# Patient Record
Sex: Male | Born: 1965
Health system: Southern US, Community
[De-identification: ages and names within clinical notes are randomized; demographics above are authoritative.]

## PROBLEM LIST (undated history)

## (undated) DIAGNOSIS — M5030 Other cervical disc degeneration, unspecified cervical region: Secondary | ICD-10-CM

## (undated) DIAGNOSIS — I10 Essential (primary) hypertension: Secondary | ICD-10-CM

## (undated) DIAGNOSIS — E785 Hyperlipidemia, unspecified: Secondary | ICD-10-CM

## (undated) DIAGNOSIS — N400 Enlarged prostate without lower urinary tract symptoms: Secondary | ICD-10-CM

## (undated) DIAGNOSIS — I2510 Atherosclerotic heart disease of native coronary artery without angina pectoris: Secondary | ICD-10-CM

## (undated) DIAGNOSIS — N182 Chronic kidney disease, stage 2 (mild): Secondary | ICD-10-CM

## (undated) DIAGNOSIS — E119 Type 2 diabetes mellitus without complications: Secondary | ICD-10-CM

## (undated) DIAGNOSIS — J9819 Other pulmonary collapse: Secondary | ICD-10-CM

## (undated) HISTORY — PX: HERNIA REPAIR: SHX51

## (undated) HISTORY — DX: Type 2 diabetes mellitus without complications: E11.9

## (undated) HISTORY — DX: Hyperlipidemia, unspecified: E78.5

## (undated) HISTORY — DX: Essential (primary) hypertension: I10

## (undated) HISTORY — DX: Atherosclerotic heart disease of native coronary artery without angina pectoris: I25.10

## (undated) HISTORY — DX: Chronic kidney disease, stage 2 (mild): N18.2

## (undated) HISTORY — PX: APPENDECTOMY: SHX54

## (undated) NOTE — *Deleted (*Deleted)
Progress Note  Patient Name: Guy [name] Date of Encounter: 01/21/2020  [facility] Cardiologist: No primary care provider on file. ***  Subjective   ***  Inpatient Medications    Scheduled Meds: . amLODipine  2.5 mg Oral Daily  . aspirin EC  81 mg Oral Daily  . atorvastatin  80 mg Oral Daily  . insulin aspart  0-15 Units Subcutaneous TID WC  . insulin aspart  0-5 Units Subcutaneous QHS  . metoprolol tartrate  25 mg Oral BID  . sodium chloride flush  3 mL Intravenous Q12H  . sodium chloride flush  3 mL Intravenous Q12H   Continuous Infusions: . sodium chloride    . sodium chloride    . sodium chloride    . heparin 1,300 Units/hr (01/21/20 1914)  . nitroGLYCERIN Stopped (01/21/20 0010)   PRN Meds: sodium chloride, sodium chloride, acetaminophen, nitroGLYCERIN, ondansetron (ZOFRAN) IV, sodium chloride flush, sodium chloride flush   Vital Signs    Vitals:   01/21/20 0453 01/21/20 0859 01/21/20 1013 01/21/20 1300  BP:  (!) 153/82 (!) 158/84 (!) 142/68  Pulse: 65 67 67 67  Resp: 18 18  18   Temp: 98 F (36.7 C) 98.2 F (36.8 C)  98.8 F (37.1 C)  TempSrc: Oral Oral  Oral  SpO2: 98%   99%  Weight: 88.7 kg     Height:        Intake/Output Summary (Last 24 hours) at 01/21/2020 1958 Last data filed at 01/21/2020 1914 Gross per 24 hour  Intake 586.99 ml  Output 2675 ml  Net -2088.01 ml   Last 3 Weights 01/21/2020 01/20/2020 01/20/2020  Weight (lbs) 195 lb 8.8 oz 199 lb 1.6 oz 197 lb 15.6 oz  Weight (kg) 88.7 kg 90.311 kg 89.8 kg      Telemetry    *** - Personally Reviewed  ECG    *** - Personally Reviewed  Physical Exam  *** GEN: No acute distress.   Neck: No JVD Cardiac: RRR, no murmurs, rubs, or gallops.  Respiratory: Clear to auscultation bilaterally. GI: Soft, nontender, non-distended  MS: No edema; No deformity. Neuro:  Nonfocal  Psych: Normal affect   Labs    High Sensitivity Troponin:   Recent Labs  Lab 01/20/20 0619  01/20/20 0819  TROPONINIHS 390* 774*      Chemistry Recent Labs  Lab 01/20/20 0619  NA 137  K 3.8  CL 99  CO2 30  GLUCOSE 123*  BUN 15  CREATININE 1.27*  CALCIUM 9.8  GFRNONAA >60  ANIONGAP 8     Hematology Recent Labs  Lab 01/20/20 0619 01/21/20 0308  WBC 9.2 11.5*  RBC 5.00 4.88  HGB 15.7 14.9  HCT 44.8 43.2  MCV 89.6 88.5  MCH 31.4 30.5  MCHC 35.0 34.5  RDW 12.2 12.3  PLT 187 196    BNPNo results for input(s): BNP, PROBNP in the last 168 hours.   DDimer No results for input(s): DDIMER in the last 168 hours.   Radiology    DG Chest 2 View  Result Date: 01/20/2020 CLINICAL DATA:  Chest pain EXAM: CHEST - 2 VIEW COMPARISON:  06/26/2013 FINDINGS: At 0640 hours. Asymmetric elevation right diaphragm. The lungs are clear without focal pneumonia, edema, pneumothorax or pleural effusion. The cardiopericardial silhouette is within normal limits for size. The visualized bony structures of the thorax show no acute abnormality. IMPRESSION: No active cardiopulmonary disease. Electronically Signed   By: Kennith Center M.D.   On: 01/20/2020 07:20  ECHOCARDIOGRAM COMPLETE  Result Date: 01/21/2020    ECHOCARDIOGRAM REPORT   Patient Name:   Guy [name] Date of Exam: 01/21/2020 Medical Rec #:  [id number]          Height:       70.0 in Accession #:    [id number]         Weight:       195.5 lb Date of Birth:  Jul 11, 1965          BSA:          2.067 m Patient Age:    54 years           BP:           146/91 mmHg Patient Gender: M                  HR:           60 bpm. Exam Location:  Inpatient Procedure: 2D Echo, Cardiac Doppler and Color Doppler Indications:    R07.9* Chest pain, unspecified  History:        Patient has no prior history of Echocardiogram examinations.                 Signs/Symptoms:Chest Pain; Risk Factors:Hypertension and                 Diabetes.  Sonographer:    Eulah Pont RDCS Referring Phys: [id number] JAMES ALLRED IMPRESSIONS  1. Left ventricular ejection  fraction, by estimation, is 60 to 65%. The left ventricle has normal function. The left ventricle has no regional wall motion abnormalities. Left ventricular diastolic parameters are consistent with Grade I diastolic dysfunction (impaired relaxation).  2. Right ventricular systolic function is normal. The right ventricular size is normal.  3. The mitral valve is normal in structure. Trivial mitral valve regurgitation. No evidence of mitral stenosis.  4. The aortic valve is tricuspid. Aortic valve regurgitation is not visualized. No aortic stenosis is present.  5. The inferior vena cava is normal in size with greater than 50% respiratory variability, suggesting right atrial pressure of 3 mmHg. FINDINGS  Left Ventricle: Left ventricular ejection fraction, by estimation, is 60 to 65%. The left ventricle has normal function. The left ventricle has no regional wall motion abnormalities. The left ventricular internal cavity size was normal in size. There is  no left ventricular hypertrophy. Left ventricular diastolic parameters are consistent with Grade I diastolic dysfunction (impaired relaxation). Right Ventricle: The right ventricular size is normal. Right ventricular systolic function is normal. Left Atrium: Left atrial size was normal in size. Right Atrium: Right atrial size was normal in size. Pericardium: There is no evidence of pericardial effusion. Mitral Valve: The mitral valve is normal in structure. Mild mitral annular calcification. Trivial mitral valve regurgitation. No evidence of mitral valve stenosis. Tricuspid Valve: The tricuspid valve is normal in structure. Tricuspid valve regurgitation is trivial. No evidence of tricuspid stenosis. Aortic Valve: The aortic valve is tricuspid. Aortic valve regurgitation is not visualized. No aortic stenosis is present. Pulmonic Valve: The pulmonic valve was normal in structure. Pulmonic valve regurgitation is not visualized. No evidence of pulmonic stenosis. Aorta:  The aortic root is normal in size and structure. Venous: The inferior vena cava is normal in size with greater than 50% respiratory variability, suggesting right atrial pressure of 3 mmHg.  LEFT VENTRICLE PLAX 2D LVIDd:         5.40 cm  Diastology LVIDs:  3.80 cm  LV e' medial:    6.30 cm/s LV PW:         1.00 cm  LV E/e' medial:  10.8 LV IVS:        1.00 cm  LV e' lateral:   8.18 cm/s LVOT diam:     1.70 cm  LV E/e' lateral: 8.3 LV SV:         54 LV SV Index:   26 LVOT Area:     2.27 cm  RIGHT VENTRICLE RV S prime:     13.00 cm/s TAPSE (M-mode): 2.0 cm LEFT ATRIUM             Index       RIGHT ATRIUM           Index LA diam:        3.40 cm 1.64 cm/m  RA Area:     10.10 cm LA Vol (A2C):   33.8 ml 16.35 ml/m RA Volume:   19.10 ml  9.24 ml/m LA Vol (A4C):   25.8 ml 12.48 ml/m LA Biplane Vol: 30.5 ml 14.75 ml/m  AORTIC VALVE LVOT Vmax:   122.00 cm/s LVOT Vmean:  84.400 cm/s LVOT VTI:    0.240 m  AORTA Ao Root diam: 3.10 cm Ao Asc diam:  2.90 cm MITRAL VALVE MV Area (PHT): 3.99 cm    SHUNTS MV Decel Time: 190 msec    Systemic VTI:  0.24 m MV E velocity: 68.30 cm/s  Systemic Diam: 1.70 cm MV A velocity: 66.80 cm/s MV E/A ratio:  1.02 Olga Millers MD Electronically signed by Olga Millers MD Signature Date/Time: 01/21/2020/10:38:40 AM    Final     Cardiac Studies   TTE 01/21/20: 1. Left ventricular ejection fraction, by estimation, is 60 to 65%. The  left ventricle has normal function. The left ventricle has no regional  wall motion abnormalities. Left ventricular diastolic parameters are  consistent with Grade I diastolic  dysfunction (impaired relaxation).  2. Right ventricular systolic function is normal. The right ventricular  size is normal.  3. The mitral valve is normal in structure. Trivial mitral valve  regurgitation. No evidence of mitral stenosis.  4. The aortic valve is tricuspid. Aortic valve regurgitation is not  visualized. No aortic stenosis is present.  5. The  inferior vena cava is normal in size with greater than 50%  respiratory variability, suggesting right atrial pressure of 3 mmHg.   Patient Profile     74 y.o. male with history of DMII and HTN who presented with chest pain found to have NSTEMI.  Assessment & Plan    #NSTEMI:  #HTN  #HLD  #DMII  #AKI  For questions or updates, please contact [facility] Please consult [email or web address] for contact info under        Signed, Meriam Sprague, MD  01/21/2020, 7:58 PM

---

## 1997-11-01 ENCOUNTER — Ambulatory Visit (HOSPITAL_BASED_OUTPATIENT_CLINIC_OR_DEPARTMENT_OTHER): Admission: RE | Admit: 1997-11-01 | Discharge: 1997-11-01 | Payer: Self-pay | Admitting: Surgery

## 2004-04-28 ENCOUNTER — Ambulatory Visit: Admission: RE | Admit: 2004-04-28 | Discharge: 2004-04-28 | Payer: Self-pay | Admitting: Internal Medicine

## 2008-01-16 ENCOUNTER — Encounter: Admission: RE | Admit: 2008-01-16 | Discharge: 2008-01-16 | Payer: Self-pay | Admitting: Orthopedic Surgery

## 2008-02-08 ENCOUNTER — Encounter: Payer: Self-pay | Admitting: Internal Medicine

## 2008-02-08 ENCOUNTER — Encounter: Admission: RE | Admit: 2008-02-08 | Discharge: 2008-02-08 | Payer: Self-pay | Admitting: Rheumatology

## 2008-07-31 ENCOUNTER — Encounter: Admission: RE | Admit: 2008-07-31 | Discharge: 2008-07-31 | Payer: Self-pay | Admitting: Internal Medicine

## 2008-08-09 ENCOUNTER — Encounter: Payer: Self-pay | Admitting: Internal Medicine

## 2008-08-20 ENCOUNTER — Encounter: Payer: Self-pay | Admitting: Internal Medicine

## 2008-09-24 DIAGNOSIS — E785 Hyperlipidemia, unspecified: Secondary | ICD-10-CM | POA: Insufficient documentation

## 2008-09-24 DIAGNOSIS — J930 Spontaneous tension pneumothorax: Secondary | ICD-10-CM | POA: Insufficient documentation

## 2008-09-24 DIAGNOSIS — J939 Pneumothorax, unspecified: Secondary | ICD-10-CM | POA: Insufficient documentation

## 2008-10-15 ENCOUNTER — Ambulatory Visit: Payer: Self-pay | Admitting: Internal Medicine

## 2008-10-15 DIAGNOSIS — R0602 Shortness of breath: Secondary | ICD-10-CM | POA: Insufficient documentation

## 2010-03-31 ENCOUNTER — Encounter: Payer: Self-pay | Admitting: Internal Medicine

## 2010-05-27 ENCOUNTER — Telehealth: Payer: Self-pay | Admitting: Internal Medicine

## 2010-06-05 NOTE — Progress Notes (Signed)
Summary: no show for ct aug 2010  ---- Converted from flag ---- ---- 10/18/2008 10:09 AM, Carron Curie CMA wrote: spoke with Okey Dupre, pt has not scheduled CT yet, she tried to contact pt yesterday and left a message for him to call and schedule.  ---- 10/15/2008 4:31 PM, Kalman Shan MD wrote: pls ensure ct chest ------------------------------

## 2010-06-05 NOTE — Progress Notes (Signed)
Summary: no show for ct chest  ---- Converted from flag ---- ---- 10/24/2008 2:45 PM, Carron Curie CMA wrote: [name] I have attempted to cll the patient as well. I will let MR know.  ---- 10/24/2008 2:40 PM, Alfonso Ramus wrote: On the day of pt's ov, he refused to schedule CT Chest. Pt stated he would have to have his calendar with him when he made appt.  Rose at [facility]. CT has called pt x 5 times with no answer and has LMOAM for him to call her to schedule. I called yesterday and again pt didn't answer phone and it rolled over to his voice mail. I LMOAM for pt to call me, that we need to get CT scheduled. Pt has not returned my call or Rose's calls. Just wanted MR to be aware. Thanks, Bjorn Loser ------------------------------

## 2013-06-26 ENCOUNTER — Emergency Department (HOSPITAL_COMMUNITY)
Admission: EM | Admit: 2013-06-26 | Discharge: 2013-06-26 | Disposition: A | Discharge status: Home / Self Care | Payer: BC Managed Care – PPO | Attending: Emergency Medicine | Admitting: Emergency Medicine

## 2013-06-26 ENCOUNTER — Telehealth (HOSPITAL_BASED_OUTPATIENT_CLINIC_OR_DEPARTMENT_OTHER): Payer: Self-pay

## 2013-06-26 ENCOUNTER — Encounter (HOSPITAL_COMMUNITY): Payer: Self-pay | Admitting: Emergency Medicine

## 2013-06-26 ENCOUNTER — Emergency Department (HOSPITAL_COMMUNITY): Payer: BC Managed Care – PPO

## 2013-06-26 DIAGNOSIS — Z7982 Long term (current) use of aspirin: Secondary | ICD-10-CM | POA: Insufficient documentation

## 2013-06-26 DIAGNOSIS — Z79899 Other long term (current) drug therapy: Secondary | ICD-10-CM | POA: Insufficient documentation

## 2013-06-26 DIAGNOSIS — Z880 Allergy status to penicillin: Secondary | ICD-10-CM | POA: Insufficient documentation

## 2013-06-26 DIAGNOSIS — Z87448 Personal history of other diseases of urinary system: Secondary | ICD-10-CM | POA: Insufficient documentation

## 2013-06-26 DIAGNOSIS — R5381 Other malaise: Secondary | ICD-10-CM | POA: Insufficient documentation

## 2013-06-26 DIAGNOSIS — E119 Type 2 diabetes mellitus without complications: Secondary | ICD-10-CM | POA: Insufficient documentation

## 2013-06-26 DIAGNOSIS — R5383 Other fatigue: Secondary | ICD-10-CM

## 2013-06-26 DIAGNOSIS — Z8709 Personal history of other diseases of the respiratory system: Secondary | ICD-10-CM | POA: Insufficient documentation

## 2013-06-26 DIAGNOSIS — R000 Tachycardia, unspecified: Secondary | ICD-10-CM | POA: Insufficient documentation

## 2013-06-26 HISTORY — DX: Benign prostatic hyperplasia without lower urinary tract symptoms: N40.0

## 2013-06-26 HISTORY — DX: Other pulmonary collapse: J98.19

## 2013-06-26 LAB — CBC
HCT: 44.3 % (ref 39.0–52.0)
Hemoglobin: 16.3 g/dL (ref 13.0–17.0)
MCH: 31.2 pg (ref 26.0–34.0)
MCHC: 36.8 g/dL — AB (ref 30.0–36.0)
MCV: 84.9 fL (ref 78.0–100.0)
PLATELETS: 214 10*3/uL (ref 150–400)
RBC: 5.22 MIL/uL (ref 4.22–5.81)
RDW: 12.1 % (ref 11.5–15.5)
WBC: 11 10*3/uL — AB (ref 4.0–10.5)

## 2013-06-26 LAB — HEPATIC FUNCTION PANEL
ALT: 31 U/L (ref 0–53)
AST: 23 U/L (ref 0–37)
Albumin: 4.2 g/dL (ref 3.5–5.2)
Alkaline Phosphatase: 150 U/L — ABNORMAL HIGH (ref 39–117)
Total Bilirubin: 1.2 mg/dL (ref 0.3–1.2)
Total Protein: 7.8 g/dL (ref 6.0–8.3)

## 2013-06-26 LAB — URINALYSIS, ROUTINE W REFLEX MICROSCOPIC
BILIRUBIN URINE: NEGATIVE
KETONES UR: NEGATIVE mg/dL
Leukocytes, UA: NEGATIVE
Nitrite: NEGATIVE
PH: 5.5 (ref 5.0–8.0)
Protein, ur: 100 mg/dL — AB
SPECIFIC GRAVITY, URINE: 1.026 (ref 1.005–1.030)
Urobilinogen, UA: 0.2 mg/dL (ref 0.0–1.0)

## 2013-06-26 LAB — BASIC METABOLIC PANEL
BUN: 22 mg/dL (ref 6–23)
CALCIUM: 11 mg/dL — AB (ref 8.4–10.5)
CHLORIDE: 92 meq/L — AB (ref 96–112)
CO2: 23 mEq/L (ref 19–32)
CREATININE: 1.06 mg/dL (ref 0.50–1.35)
GFR, EST NON AFRICAN AMERICAN: 82 mL/min — AB (ref >90)
Glucose, Bld: 411 mg/dL — ABNORMAL HIGH (ref 70–99)
Potassium: 4.4 mEq/L (ref 3.7–5.3)
Sodium: 132 mEq/L — ABNORMAL LOW (ref 137–147)

## 2013-06-26 LAB — BLOOD GAS, VENOUS
ACID-BASE EXCESS: 3.6 mmol/L — AB (ref 0.0–2.0)
BICARBONATE: 28.8 meq/L — AB (ref 20.0–24.0)
FIO2: 0.21 %
O2 SAT: 48.7 %
PATIENT TEMPERATURE: 98.6
TCO2: 24.9 mmol/L (ref 0–100)
pCO2, Ven: 47.3 mmHg (ref 45.0–50.0)
pH, Ven: 7.402 — ABNORMAL HIGH (ref 7.250–7.300)

## 2013-06-26 LAB — HEMOGLOBIN A1C
Hgb A1c MFr Bld: 8.9 % — ABNORMAL HIGH (ref <5.7)
Mean Plasma Glucose: 209 mg/dL — ABNORMAL HIGH (ref <117)

## 2013-06-26 LAB — I-STAT TROPONIN, ED: TROPONIN I, POC: 0.01 ng/mL (ref 0.00–0.08)

## 2013-06-26 LAB — CBG MONITORING, ED
GLUCOSE-CAPILLARY: 280 mg/dL — AB (ref 70–99)
Glucose-Capillary: 239 mg/dL — ABNORMAL HIGH (ref 70–99)

## 2013-06-26 LAB — D-DIMER, QUANTITATIVE (NOT AT ARMC): D DIMER QUANT: 0.29 ug{FEU}/mL (ref 0.00–0.48)

## 2013-06-26 LAB — URINE MICROSCOPIC-ADD ON

## 2013-06-26 LAB — PRO B NATRIURETIC PEPTIDE: PRO B NATRI PEPTIDE: 12.7 pg/mL (ref 0–125)

## 2013-06-26 LAB — TROPONIN I

## 2013-06-26 MED ORDER — METFORMIN HCL 500 MG PO TABS: 500.0000 mg | ORAL_TABLET | Freq: Two times a day (BID) | ORAL | Status: DC

## 2013-06-26 MED ORDER — LIVING WELL WITH DIABETES BOOK
Freq: Once | Status: DC
  Filled 2013-06-26: qty 1

## 2013-06-26 MED ORDER — SODIUM CHLORIDE 0.9 % IV BOLUS (SEPSIS)
1000.0000 mL | Freq: Once | INTRAVENOUS | Status: AC
  Administered 2013-06-26: 1000 mL via INTRAVENOUS

## 2013-06-26 MED ORDER — METFORMIN HCL 500 MG PO TABS
500.0000 mg | ORAL_TABLET | Freq: Once | ORAL | Status: AC
  Administered 2013-06-26: 500 mg via ORAL
  Filled 2013-06-26: qty 1

## 2013-06-26 MED ORDER — ASPIRIN 81 MG PO CHEW
324.0000 mg | CHEWABLE_TABLET | Freq: Once | ORAL | Status: AC
  Administered 2013-06-26: 324 mg via ORAL
  Filled 2013-06-26: qty 4

## 2013-06-26 MED ORDER — INSULIN ASPART 100 UNIT/ML ~~LOC~~ SOLN
6.0000 [IU] | Freq: Once | SUBCUTANEOUS | Status: AC
  Administered 2013-06-26: 6 [IU] via SUBCUTANEOUS
  Filled 2013-06-26: qty 1

## 2013-06-26 MED ORDER — BLOOD GLUCOSE METER KIT: PACK | Status: DC

## 2013-06-26 MED ORDER — LANCETS THIN MISC: 1.0000 | Freq: Three times a day (TID) | Status: DC

## 2013-06-26 NOTE — Progress Notes (Addendum)
Inpatient Diabetes Program Recommendations  AACE/ADA: New Consensus Statement on Inpatient Glycemic Control (2013)  Target Ranges:  Prepandial:   less than 140 mg/dL      Peak postprandial:   less than 180 mg/dL (1-2 hours)      Critically ill patients:  140 - 180 mg/dL   Reason for Visit: Diabetes Consult  Diabetes history: Newly-diagnosed Outpatient Diabetes medications: None Current orders for Inpatient glycemic control: None - Received Novolog 6 units.  Pt states he has family hx DM. Has c/o thirst, blurred vision and feeling tired. States his PCP is Dr. Selena BattenKim and he was planning to make appt with him this week. Pt is willing to purchase meter to check blood sugars and would like to go to [facility] for OP Diabetes Education. States weight has been about the same for the past few years. Would like to lose approx 40 pounds. Eats out occasionally and ususally takes his lunch to work. Walks several miles/day at work. No ETOH.  Lengthy discussion regarding new diagnosis of DM, importance of glucose control to prevent long-term complications, and f/u with PCP to manage DM. Discussed how diet, exercise and stress affect blood sugars. Discussed decreasing intake of simple CHOs such as apple juice, orange juice, regular sodas and Gatorade. Encouraged to eat regular meals and decrease portion sizes. Focus on lean meats, low-fat dairy products, along with high fiber foods and plenty of vegetables, beans. Living Well With Diabetes book has been ordered, along with Exit Care Notes. Answered questions and discussed above with RN.  Recommendations:  Novolog sensitive Q4H or when po diet begins, tidwc and hs. Check HgbA1C to assess glycemic control prior to hospitalization. OP Diabetes Education at [facility] for newly-diagnosed DM When discharged, purchase home glucose meter and check blood sugars 3-4 times/day. Take logbook to PCP for glycemic  control management. Metformin 500 QD. If tolerated, 500 bid.   **Will need prescription for meter, strips and lancets.  Thank you. Ailene Ardshonda Vikki Gains, RD, LDN, CDE Inpatient Diabetes Coordinator [phone]

## 2013-06-26 NOTE — ED Notes (Signed)
Patient informed of serum glucose and rationale for administering insulin Patient now reports increased thirst and urination for the past week MD at bedside and is aware

## 2013-06-26 NOTE — Progress Notes (Signed)
  CARE MANAGEMENT ED NOTE 06/26/2013  Patient:  Guy [name],Guy [name]   Account Number:  [id number]  Date Initiated:  06/26/2013  Documentation initiated by:  Richelle Glick  Subjective/Objective Assessment:   48 yr old self [place] pt with new onset DM II seen by DM coordinator in [facility] ED     Subjective/Objective Assessment Detail:   Pt states his pcp is [facility] Kim EPIC updated and he will be returning to speak with a nutritionist set up by DM coordinator in 1-2 weeks     Action/Plan:   ED Cm spoke with pt about a local DM discussion group on Aug 04 2013 and provided local Dm resources related to glucometers, and medications   Action/Plan Detail:   CM answered pt questions   Anticipated DC Date:  06/26/2013     Status Recommendation to Physician:   Result of Recommendation:    Other ED Services  Consult Working Plan    DC Planning Services  Other  PCP issues  Outpatient Services - Pt will follow up    Choice offered to / List presented to:            Status of service:  Completed, signed off  ED Comments:   ED Comments Detail:     

## 2013-06-26 NOTE — ED Notes (Signed)
Patient transported to X-ray 

## 2013-06-26 NOTE — ED Notes (Signed)
Patient ambulated without difficulty. Patient's o2 sat went to 96%. Oxygen sat stayed between 96% and 97%.Patient states that he feels much better and doesn't feel as short of breath. RN Morrie SheldonAshley notified.

## 2013-06-26 NOTE — ED Provider Notes (Signed)
Assumed care from Dr Rhunette CroftNanavati in sign out. New onset diabetes. Diabetes coordinator med with pt. A1C ordered. Script for metformin. D-dimer and other labs fairly unremarkable. Likely hypertension as well. Needs repeat. Can be reassessed on outpt FU.   Raeford RazorStephen Rozelia Catapano, MD 06/26/13 [phone]

## 2013-06-26 NOTE — ED Provider Notes (Signed)
CSN: [id number]     Arrival date & time 06/26/13  0302 History   First MD Initiated Contact with Patient 06/26/13 0327     Chief Complaint  Patient presents with  . Shortness of Breath     (Consider location/radiation/quality/duration/timing/severity/associated sxs/prior Treatment) HPI Comments: 48 y/o comes in with cc of dib. States that he woke up in the middle of the night to go the bathroom, and started feeling short of breath. He also had palpitations. Symptoms improved with rest. Pt's sx improved after resting for few minutes. He has been having these intermittent episdoes last few days. No true chest pain. Pt denies any hx of PE, DVt, or orthopnea, PND like sx. No new leg swelling. ROS is + for increased urination the last few days.  Patient is a 48 y.o. male presenting with shortness of breath. The history is provided by the patient.  Shortness of Breath Associated symptoms: no abdominal pain, no chest pain and no cough     Past Medical History  Diagnosis Date  . Enlarged prostate   . Collapsed lung    Past Surgical History  Procedure Laterality Date  . Appendectomy     History reviewed. No pertinent family history. History  Substance Use Topics  . Smoking status: Never Smoker   . Smokeless tobacco: Never Used  . Alcohol Use: No    Review of Systems  Constitutional: Positive for fatigue. Negative for activity change and appetite change.  Respiratory: Positive for shortness of breath. Negative for cough.   Cardiovascular: Negative for chest pain.  Gastrointestinal: Negative for abdominal pain.  Genitourinary: Negative for dysuria.  All other systems reviewed and are negative.     Allergies  Penicillins and Sulfa antibiotics  Home Medications   Prior to Admission medications   Medication Sig Start Date End Date Taking? Authorizing Provider  aspirin EC 81 MG tablet Take 81 mg by mouth at bedtime.   Yes Historical Provider, MD  ibuprofen (ADVIL,MOTRIN) 200 MG  tablet Take 200-400 mg by mouth every 8 (eight) hours as needed (for pain).   Yes Historical Provider, MD  metFORMIN (GLUCOPHAGE) 500 MG tablet Take 1 tablet (500 mg total) by mouth 2 (two) times daily with a meal. 06/26/13   Shirell Struthers, MD   BP 145/83  Pulse 93  Temp(Src) 97.9 F (36.6 C) (Oral)  Resp 12  Ht 5\' 10"  (1.778 m)  Wt 220 lb (99.791 kg)  BMI 31.57 kg/m2  SpO2 100% Physical Exam  Nursing note and vitals reviewed. Constitutional: He is oriented to person, place, and time. He appears well-developed.  HENT:  Head: Normocephalic and atraumatic.  Eyes: Conjunctivae and EOM are normal. Pupils are equal, round, and reactive to light.  Neck: Normal range of motion. Neck supple. No JVD present.  Cardiovascular: Normal rate and regular rhythm.   Pulmonary/Chest: Effort normal and breath sounds normal.  Abdominal: Soft. Bowel sounds are normal. He exhibits no distension. There is no tenderness. There is no rebound and no guarding.  Musculoskeletal: He exhibits no edema.  Neurological: He is alert and oriented to person, place, and time.  Skin: Skin is warm.    ED Course  Procedures (including critical care time) Labs Review Labs Reviewed  CBC - Abnormal; Notable for the following:    WBC 11.0 (*)    MCHC 36.8 (*)    All other components within normal limits  BASIC METABOLIC PANEL - Abnormal; Notable for the following:    Sodium 132 (*)  Chloride 92 (*)    Glucose, Bld 411 (*)    Calcium 11.0 (*)    GFR calc non Af Amer 82 (*)    All other components within normal limits  BLOOD GAS, VENOUS - Abnormal; Notable for the following:    pH, Ven 7.402 (*)    Bicarbonate 28.8 (*)    Acid-Base Excess 3.6 (*)    All other components within normal limits  URINALYSIS, ROUTINE W REFLEX MICROSCOPIC - Abnormal; Notable for the following:    Glucose, UA >1000 (*)    Hgb urine dipstick TRACE (*)    Protein, ur 100 (*)    All other components within normal limits  CBG  MONITORING, ED - Abnormal; Notable for the following:    Glucose-Capillary 280 (*)    All other components within normal limits  URINE MICROSCOPIC-ADD ON  Rosezena SensorI-STAT TROPOININ, ED    Imaging Review Dg Chest 2 View  06/26/2013   CLINICAL DATA:  Shortness of breath since this morning.  EXAM: CHEST  2 VIEW  COMPARISON:  DG THORACIC SPINE W/SWIMMERS dated 02/08/2008; DG CHEST 2 VIEW dated 02/08/2008  FINDINGS: Shallow inspiration with elevation of the right hemidiaphragm. The heart size and mediastinal contours are within normal limits. Both lungs are clear. Degenerative changes in the thoracic spine. The visualized skeletal structures are otherwise unremarkable.  IMPRESSION: No active cardiopulmonary disease.   Electronically Signed   By: Burman NievesWilliam  Stevens M.D.   On: 06/26/2013 03:55     EKG Interpretation   Date/Time:  Monday June 26 2013 03:13:47 EDT Ventricular Rate:  108 PR Interval:  130 QRS Duration: 68 QT Interval:  311 QTC Calculation: 417 R Axis:   25 Text Interpretation:  Sinus tachycardia Ventricular premature complex  Aberrant complex Probable left atrial enlargement Minimal ST depression,  anterolateral leads Confirmed by Rhunette CroftNANAVATI, MD, Janey GentaANKIT [phone] on 06/26/2013  3:27:38 AM Also confirmed by Rhunette CroftNANAVATI, MD, Janey GentaANKIT [id number])  on 06/26/2013  7:30:59 AM      MDM   Final diagnoses:  Diabetes   PT comes in with cc of dib. Currently sx free, but is noted to be tachycardic. Pt's EKG shows sinus tachycardia. No PE, DVT risk factors, and my clinical gestalt for PE is extremely low. Pt's blood sugar is >400. No anion gap. No acidosis. i think the increased urination is DM related, and tachycardia and dib likely due to dehydration from above,  Pt's CBG improved with hydration and a small dose of insulin. DM material printed out, and  to see his pcp this week. Return precautions discussed.  Derwood KaplanAnkit Nazire Fruth, MD 06/26/13 [phone]

## 2013-06-26 NOTE — Telephone Encounter (Signed)
Fax rcvd from [facility] w/High Hemoglobin A1C. 8.9.  Pt seen by Dr Juleen ChinaKohut @ [facility] today.  Dr Juleen ChinaKohut notified no new instructions rcvd.

## 2013-06-26 NOTE — ED Notes (Signed)
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

## 2013-06-26 NOTE — ED Notes (Signed)
rx x 3 given CBG 239

## 2013-06-26 NOTE — ED Notes (Signed)
Diabetic coordinator present to speak with pt

## 2013-06-26 NOTE — ED Notes (Addendum)
Pt reports that he awoke this morning at 0230 feeling ShOB, states he has been having difficulty breathing for the past few weeks. Pt reports that he also felt like his HR was increased, took 325mg  Aspirin and drank a bottle of water and by the time he came to the ED felt better. Pt a&o x4, noted to have hypertension and tachycardia in triage.

## 2013-06-26 NOTE — Discharge Instructions (Signed)
Results here show that your sugar is elevated - and that this might be due to new diabetes. Please see your doctor this week. Take the meds prescribed. Please return to the ER if your symptoms worsen; you have increased pain, fevers, chills, inability to keep any medications down, confusion. Otherwise see the outpatient doctor as requested.   Diabetes, Type 2, Am I At Risk? Diabetes is a lasting (chronic) disease. In type 2 diabetes, the pancreas does not make enough insulin, and the body does not respond normally to the insulin that is made. This type of diabetes was also previously called adult onset diabetes. About 90% of all those who have diabetes have type 2. It usually occurs after the age of 48, but can occur at any age.  People develop type 2 diabetes because they do not use insulin properly. Eventually, the pancreas cannot make enough insulin for the body's needs. Over time, the amount of glucose (sugar) in the blood increases. RISK FACTORS  Overweight  the more weight you have, the more resistant your cells become to insulin.  Family history  you are more likely to get diabetes if a parent or sibling has diabetes.  Race certain races get diabetes more.  African Americans.  American Indians.  Asian Americans.  Hispanics.  Pacific Islander.  Inactive exercise helps control weight and helps your cells be more sensitive to insulin.  Gestational diabetes  some women develop diabetes while they are pregnant. This goes away when they deliver. However, they are 50-60% more likely to develop type 2 diabetes at a later time.  Having a baby over 9 pounds  a sign that you may have had gestational diabetes.  Age the risk of diabetes goes up as you get older, especially after age 48.  High blood pressure (hypertension). SYMPTOMS Many people have no signs or symptoms. Symptoms can be so mild that you might not even notice them. Some of these signs are:  Increased  thirst.  Increased hunger.  Tiredness (fatigue).  Increased urination, especially at night.  Weight loss.  Blurred vision.  Sores that do not heal. WHO SHOULD BE TESTED?  Anyone 45 years or older, especially if overweight, should consider getting tested.  If you are younger than 45, overweight, and have one or more of the risk factors, you should consider getting tested. DIAGNOSIS  Fasting blood glucose (FBS). Usually, 2 are done.  FBS 101-125 mg/dl is considered pre-diabetes.  FBS 126 mg/dl or greater is considered diabetes.  2 hour Oral Glucose Tolerance Test (OGTT). This test is preformed by first having you not eat or drink for several hours. You are then given something sweet to drink and your blood glucose is measured fasting, at one hour and 2 hours. This test tells how well you are able to handle sugars or carbohydrates.  Fasting: 60-100 mg/dl.  1 hour: less than 200 mg/dl.  2 hours: less than 140 mg/dl.  A1c A1c is a blood glucose test that gives and average of your blood glucose over 3 months. It is the accepted method to use to diagnose diabetes.  A1c 5.7-6.4% is considered pre-diabetes.  A1c 6.5% or greater is considered diabetes. WHAT DOES IT MEAN TO HAVE PRE-DIABETES? Pre-diabetes means you are at risk for getting type 2 diabetes. Your blood glucose is higher than normal, but not yet high enough to diagnose diabetes. The good news is, if you have pre-diabetes you can reduce the risk of getting diabetes and even return to normal  blood glucose levels. With modest weight loss and moderate physical activity, you can delay or prevent type 2 diabetes.  PREVENTION You cannot do anything about race, age or family history, but you can lower your chances of getting diabetes. You can:   Exercise regularly and be active.  Reduce fat and calorie intake.  Make wise food choices as much as you can.  Reduce your intake of salt and alcohol.  Maintain a reasonable  weight.  Keep blood pressure in an acceptable range. Take medication if needed.  Not smoke.  Maintain an acceptable cholesterol level (HDL, LDL, Triglycerides). Take medication if needed. DOING MY PART: GETTING STARTED Making big changes in your life is hard, especially if you are faced with more than one change. You can make it easier by taking these steps:  Make a plan to change behavior.  Decide exactly what you will do and when you will do it.  Plan what you need to get ready.  Think about what might prevent you from reaching your goals.  Find family and friends who will support and encourage you.  Decide how you will reward yourself when you do what you have planned.  Your doctor, dietitian, or counselor can help you make a plan. HERE ARE SOME OF THE AREAS YOU MAY WISH TO CHANGE TO REDUCE YOUR RISK OF DIABETES. If you are overweight or obese, choose sensible ways to get in shape. Even small amounts of weight loss, like 5-10 pounds, can help reduce the effects of insulin resistance and help blood glucose control. Diet  Avoid crash diets. Instead, eat less of the foods you usually have. Limit the amount of fat you eat.  Increase your physical activity. Aim for at least 30 minutes of exercise most days of the week.  Set a reasonable weight-loss goal, such as losing 1 pound a week. Aim for a long-term goal of losing 5-7% of your total body weight.  Make wise food choices most of the time.  What you eat has a big impact on your health. By making wise food choices, you can help control your body weight, blood pressure, and cholesterol.  Take a hard look at the serving sizes of the foods you eat. Reduce serving sizes of meat, desserts, and foods high in fat. Increase your intake of fruits and vegetables.  Limit your fat intake to about 25% of your total calories. For example, if your food choices add up to about 2,000 calories a day, try to eat no more than 56 grams of fat. Your  caregiver or a dietitian can help you figure out how much fat to have. You can check food labels for fat content too.  You may also want to reduce the number of calories you have each day.  Keep a food log. Write down what you eat, how much you eat, and anything else that helps keep you on track.  When you meet your goal, reward yourself with a nonfood item or activity. Exercise  Be physically active every day.  Keep and exercise log. Write down what exercise you did, for how long, and anything else that keeps you on track.  Regular exercise (like brisk walking) tackles several risk factors at once. It helps you lose weight, it keeps your cholesterol and blood pressure under control, and it helps your body use insulin. People who are physically active for 30 minutes a day, 5 days a week, reduced their risk of type 2 diabetes. If you are not  very active, you should start slowly at first. Talk with your caregiver first about what kinds of exercise would be safe for you. Make a plan to increase your activity level with the goal of being active for at least 30 minutes a day, most days of the week.  Choose activities you enjoy. Here are some ways to work extra activity into your daily routine:  Take the stairs rather than an elevator or escalator.  Park at the far end of the lot and walk.  Get off the bus a few stops early and walk the rest of the way.  Walk or bicycle instead of drive whenever you can. Medications Some people need medication to help control their blood pressure or cholesterol levels. If you do, take your medicines as directed. Ask your caregiver whether there are any medicines you can take to prevent type 2 diabetes. Document Released: 02/26/2003 Document Revised: 05/18/2011 Document Reviewed: 11/21/2008 [facility] Patient Information 2014 [place], [place].  Diabetes Meal Planning Guide The diabetes meal planning guide is a tool to help you plan your meals and snacks. It is  important for people with diabetes to manage their blood glucose (sugar) levels. Choosing the right foods and the right amounts throughout your day will help control your blood glucose. Eating right can even help you improve your blood pressure and reach or maintain a healthy weight. CARBOHYDRATE COUNTING MADE EASY When you eat carbohydrates, they turn to sugar. This raises your blood glucose level. Counting carbohydrates can help you control this level so you feel better. When you plan your meals by counting carbohydrates, you can have more flexibility in what you eat and balance your medicine with your food intake. Carbohydrate counting simply means adding up the total amount of carbohydrate grams in your meals and snacks. Try to eat about the same amount at each meal. Foods with carbohydrates are listed below. Each portion below is 1 carbohydrate serving or 15 grams of carbohydrates. Ask your dietician how many grams of carbohydrates you should eat at each meal or snack. Grains and Starches  1 slice bread.   English muffin or hotdog/hamburger bun.   cup cold cereal (unsweetened).   cup cooked pasta or rice.   cup starchy vegetables (corn, potatoes, peas, beans, winter squash).  1 tortilla (6 inches).   bagel.  1 waffle or pancake (size of a CD).   cup cooked cereal.  4 to 6 small crackers. *Whole grain is recommended. Fruit  1 cup fresh unsweetened berries, melon, papaya, pineapple.  1 small fresh fruit.   banana or mango.   cup fruit juice (4 oz unsweetened).   cup canned fruit in natural juice or water.  2 tbs dried fruit.  12 to 15 grapes or cherries. Milk and Yogurt  1 cup fat-free or 1% milk.  1 cup soy milk.  6 oz light yogurt with sugar-free sweetener.  6 oz low-fat soy yogurt.  6 oz plain yogurt. Vegetables  1 cup raw or  cup cooked is counted as 0 carbohydrates or a "free" food.  If you eat 3 or more servings at 1 meal, count them as 1  carbohydrate serving. Other Carbohydrates   oz chips or pretzels.   cup ice cream or frozen yogurt.   cup sherbet or sorbet.  2 inch square cake, no frosting.  1 tbs honey, sugar, jam, jelly, or syrup.  2 small cookies.  3 squares of graham crackers.  3 cups popcorn.  6 crackers.  1 cup  broth-based soup.  Count 1 cup casserole or other mixed foods as 2 carbohydrate servings.  Foods with less than 20 calories in a serving may be counted as 0 carbohydrates or a "free" food. You may want to purchase a book or computer software that lists the carbohydrate gram counts of different foods. In addition, the nutrition facts panel on the labels of the foods you eat are a good source of this information. The label will tell you how big the serving size is and the total number of carbohydrate grams you will be eating per serving. Divide this number by 15 to obtain the number of carbohydrate servings in a portion. Remember, 1 carbohydrate serving equals 15 grams of carbohydrate. SERVING SIZES Measuring foods and serving sizes helps you make sure you are getting the right amount of food. The list below tells how big or small some common serving sizes are.  1 oz.........4 stacked dice.  3 oz........Marland KitchenDeck of cards.  1 tsp.......Marland KitchenTip of little finger.  1 tbs......Marland KitchenMarland KitchenThumb.  2 tbs.......Marland KitchenGolf ball.   cup......Marland KitchenHalf of a fist.  1 cup.......Marland KitchenA fist. SAMPLE DIABETES MEAL PLAN Below is a sample meal plan that includes foods from the grain and starches, dairy, vegetable, fruit, and meat groups. A dietician can individualize a meal plan to fit your calorie needs and tell you the number of servings needed from each food group. However, controlling the total amount of carbohydrates in your meal or snack is more important than making sure you include all of the food groups at every meal. You may interchange carbohydrate containing foods (dairy, starches, and fruits). The meal plan below is an  example of a 2000 calorie diet using carbohydrate counting. This meal plan has 17 carbohydrate servings. Breakfast  1 cup oatmeal (2 carb servings).   cup light yogurt (1 carb serving).  1 cup blueberries (1 carb serving).   cup almonds. Snack  1 large apple (2 carb servings).  1 low-fat string cheese stick. Lunch  Chicken breast salad.  1 cup spinach.   cup chopped tomatoes.  2 oz chicken breast, sliced.  2 tbs low-fat [place] dressing.  12 whole-wheat crackers (2 carb servings).  12 to 15 grapes (1 carb serving).  1 cup low-fat milk (1 carb serving). Snack  1 cup carrots.   cup hummus (1 carb serving). Dinner  3 oz broiled salmon.  1 cup brown rice (3 carb servings). Snack  1  cups steamed broccoli (1 carb serving) drizzled with 1 tsp olive oil and lemon juice.  1 cup light pudding (2 carb servings). DIABETES MEAL PLANNING WORKSHEET Your dietician can use this worksheet to help you decide how many servings of foods and what types of foods are right for you.  BREAKFAST Food Group and Servings / Carb Servings Grain/Starches __________________________________ Dairy __________________________________________ Vegetable ______________________________________ Fruit ___________________________________________ Meat __________________________________________ Fat ____________________________________________ LUNCH Food Group and Servings / Carb Servings Grain/Starches ___________________________________ Dairy ___________________________________________ Fruit ____________________________________________ Meat ___________________________________________ Fat _____________________________________________ Laural [place] Food Group and Servings / Carb Servings Grain/Starches ___________________________________ Dairy ___________________________________________ Fruit ____________________________________________ Meat ___________________________________________ Fat  _____________________________________________ SNACKS Food Group and Servings / Carb Servings Grain/Starches ___________________________________ Dairy ___________________________________________ Vegetable _______________________________________ Fruit ____________________________________________ Meat ___________________________________________ Fat _____________________________________________ DAILY TOTALS Starches _________________________ Vegetable ________________________ Fruit ____________________________ Dairy ____________________________ Meat ____________________________ Fat ______________________________ Document Released: 11/20/2004 Document Revised: 05/18/2011 Document Reviewed: 10/01/2008 [employer] Patient Information 2014 [employer].  Diabetes and Exercise Exercising regularly is important. It is not just about losing weight. It has many health benefits, such as:  Improving your  overall fitness, flexibility, and endurance.  Increasing your bone density.  Helping with weight control.  Decreasing your body fat.  Increasing your muscle strength.  Reducing stress and tension.  Improving your overall health. People with diabetes who exercise gain additional benefits because exercise:  Reduces appetite.  Improves the body's use of blood sugar (glucose).  Helps lower or control blood glucose.  Decreases blood pressure.  Helps control blood lipids (such as cholesterol and triglycerides).  Improves the body's use of the hormone insulin by:  Increasing the body's insulin sensitivity.  Reducing the body's insulin needs.  Decreases the risk for heart disease because exercising:  Lowers cholesterol and triglycerides levels.  Increases the levels of good cholesterol (such as high-density lipoproteins [HDL]) in the body.  Lowers blood glucose levels. YOUR ACTIVITY PLAN  Choose an activity that you enjoy and set realistic goals. Your health care provider or  diabetes educator can help you make an activity plan that works for you. You can break activities into 2 or 3 sessions throughout the day. Doing so is as good as one long session. Exercise ideas include:  Taking the dog for a walk.  Taking the stairs instead of the elevator.  Dancing to your favorite song.  Doing your favorite exercise with a friend. RECOMMENDATIONS FOR EXERCISING WITH TYPE 1 OR TYPE 2 DIABETES   Check your blood glucose before exercising. If blood glucose levels are greater than 240 mg/dL, check for urine ketones. Do not exercise if ketones are present.  Avoid injecting insulin into areas of the body that are going to be exercised. For example, avoid injecting insulin into:  The arms when playing tennis.  The legs when jogging.  Keep a record of:  Food intake before and after you exercise.  Expected peak times of insulin action.  Blood glucose levels before and after you exercise.  The type and amount of exercise you have done.  Review your records with your health care provider. Your health care provider will help you to develop guidelines for adjusting food intake and insulin amounts before and after exercising.  If you take insulin or oral hypoglycemic agents, watch for signs and symptoms of hypoglycemia. They include:  Dizziness.  Shaking.  Sweating.  Chills.  Confusion.  Drink plenty of water while you exercise to prevent dehydration or heat stroke. Body water is lost during exercise and must be replaced.  Talk to your health care provider before starting an exercise program to make sure it is safe for you. Remember, almost any type of activity is better than none. Document Released: 05/16/2003 Document Revised: 10/26/2012 Document Reviewed: 08/02/2012 [facility] Patient Information 2014 [place]

## 2013-08-01 ENCOUNTER — Encounter: Payer: BC Managed Care – PPO | Attending: Emergency Medicine

## 2013-08-01 VITALS — Ht 70.0 in | Wt 203.0 lb

## 2013-08-01 DIAGNOSIS — Z713 Dietary counseling and surveillance: Secondary | ICD-10-CM | POA: Insufficient documentation

## 2013-08-01 DIAGNOSIS — E119 Type 2 diabetes mellitus without complications: Secondary | ICD-10-CM | POA: Insufficient documentation

## 2013-08-08 ENCOUNTER — Encounter: Payer: BC Managed Care – PPO | Attending: Internal Medicine

## 2013-08-08 DIAGNOSIS — E119 Type 2 diabetes mellitus without complications: Secondary | ICD-10-CM | POA: Insufficient documentation

## 2013-08-08 DIAGNOSIS — Z713 Dietary counseling and surveillance: Secondary | ICD-10-CM | POA: Insufficient documentation

## 2013-08-08 NOTE — Progress Notes (Signed)
Patient was seen on 08/01/13 for the first of a series of three diabetes self-management courses at [facility].  Current HbA1c: 7.5%  The following learning objectives were met by the patient during this class:  Describe diabetes  State some common risk factors for diabetes  Defines the role of glucose and insulin  Identifies type of diabetes and pathophysiology  Describe the relationship between diabetes and cardiovascular risk  State the members of the Healthcare Team  States the rationale for glucose monitoring  State when to test glucose  State their individual Target Range  State the importance of logging glucose readings  Describe how to interpret glucose readings  Identifies A1C target  Explain the correlation between A1c and eAG values  State symptoms and treatment of high blood glucose  State symptoms and treatment of low blood glucose  Explain proper technique for glucose testing  Identifies proper sharps disposal  Handouts given during class include:  Living Well with Diabetes book  Carb Counting and Meal Planning book  Meal Plan Card  Carbohydrate guide  Meal planning worksheet  Low Sodium Flavoring Tips  The diabetes portion plate  Y7S to eAG Conversion Chart  Diabetes Medications  Diabetes Recommended Care Schedule  Support Group  Diabetes Success Plan  Core Class Satisfaction Survey  Follow-Up Plan:  Attend core 2

## 2013-08-09 NOTE — Progress Notes (Signed)

## 2013-08-15 DIAGNOSIS — E119 Type 2 diabetes mellitus without complications: Secondary | ICD-10-CM

## 2013-08-15 NOTE — Progress Notes (Signed)
Patient was seen on 08/15/13 for the third of a series of three diabetes self-management courses at the [facility]. The following learning objectives were met by the patient during this class:    State the amount of activity recommended for healthy living   Describe activities suitable for individual needs   Identify ways to regularly incorporate activity into daily life   Identify barriers to activity and ways to over come these barriers  Identify diabetes medications being personally used and their primary action for lowering glucose and possible side effects   Describe role of stress on blood glucose and develop strategies to address psychosocial issues   Identify diabetes complications and ways to prevent them  Explain how to manage diabetes during illness   Evaluate success in meeting personal goal   Establish 2-3 goals that they will plan to diligently work on until they return for the  4-month follow-up visit  Goals:  Follow Diabetes Meal Plan as instructed  Aim for 15-30 mins of physical activity daily as tolerated  Bring food record and glucose log to your follow up visit  Your patient has established the following 4 month goals in their individualized success plan: I will increase my activity level   Your patient has identified these potential barriers to change:  None stated  Your patient has identified their diabetes self-care support plan as  [facility] Support Group available  Plan:  Attend Core 4 in 4 months    

## 2014-07-10 ENCOUNTER — Encounter: Payer: Self-pay | Admitting: Podiatry

## 2014-07-10 ENCOUNTER — Ambulatory Visit (INDEPENDENT_AMBULATORY_CARE_PROVIDER_SITE_OTHER): Payer: 59

## 2014-07-10 ENCOUNTER — Ambulatory Visit (INDEPENDENT_AMBULATORY_CARE_PROVIDER_SITE_OTHER): Payer: 59 | Admitting: Podiatry

## 2014-07-10 VITALS — BP 122/68 | HR 83 | Resp 18

## 2014-07-10 DIAGNOSIS — M722 Plantar fascial fibromatosis: Secondary | ICD-10-CM | POA: Diagnosis not present

## 2014-07-10 MED ORDER — DICLOFENAC SODIUM 75 MG PO TBEC: 75.0000 mg | DELAYED_RELEASE_TABLET | Freq: Two times a day (BID) | ORAL | Status: DC

## 2014-07-10 MED ORDER — TRIAMCINOLONE ACETONIDE 10 MG/ML IJ SUSP
10.0000 mg | Freq: Once | INTRAMUSCULAR | Status: AC
  Administered 2014-07-10: 10 mg

## 2014-07-10 NOTE — Patient Instructions (Signed)

## 2014-07-11 NOTE — Progress Notes (Signed)
Subjective:     Patient ID: Guy HsuWilliam D [name], male   DOB: Jan 17, 1966, 49 y.o.   MRN: [id number]  HPI patient states that he has developed a lot of pain in his right heel that's been present for around 3 months. States it's worse when he gets up in the morning and after periods of sitting   Review of Systems  All other systems reviewed and are negative.      Objective:   Physical Exam  Constitutional: He is oriented to person, place, and time.  Cardiovascular: Intact distal pulses.   Musculoskeletal: Normal range of motion.  Neurological: He is oriented to person, place, and time.  Skin: Skin is warm.  Nursing note and vitals reviewed.  neurovascular status found to be intact with muscle strength adequate and range of motion subtalar midtarsal joint within normal limits. Patient has mild equinus condition bilateral and is noted to have severe discomfort on the right plantar heel at the insertional point of the tendon into the calcaneus with inflammation and fluid buildup noted. Moderate depression of the arch is noted upon weightbearing     Assessment:     Antar fasciitis right of an acute nature with probable chronic manifestations    Plan:     H&P and x-rays reviewed with patient. Injected the right plantar fascia 3 mg Kenalog 5 mg Xylocaine and applied fascial brace with instructions. Placed on diclofenac 75 mg twice a day and gave instructions on physical therapy and reappoint in 1 week

## 2014-07-20 ENCOUNTER — Encounter: Payer: Self-pay | Admitting: Podiatry

## 2014-07-20 ENCOUNTER — Ambulatory Visit (INDEPENDENT_AMBULATORY_CARE_PROVIDER_SITE_OTHER): Payer: 59 | Admitting: Podiatry

## 2014-07-20 VITALS — BP 178/94 | HR 69 | Resp 13

## 2014-07-20 DIAGNOSIS — M722 Plantar fascial fibromatosis: Secondary | ICD-10-CM

## 2014-07-20 MED ORDER — TRIAMCINOLONE ACETONIDE 10 MG/ML IJ SUSP
10.0000 mg | Freq: Once | INTRAMUSCULAR | Status: AC
  Administered 2014-07-20: 10 mg

## 2014-07-20 NOTE — Patient Instructions (Signed)

## 2014-07-22 NOTE — Progress Notes (Signed)
Subjective:     Patient ID: Guy HsuWilliam D [name], male   DOB: 27-Jun-1965, 49 y.o.   MRN: [id number]  HPI patient states it's quite a bit better but there still is a spot that is painful on my right heel   Review of Systems     Objective:   Physical Exam Neurovascular status intact muscle strength adequate with continued discomfort right plantar fashion at the insertional point tendon into the calcaneus    Assessment:     Plantar fasciitis right with inflammation and fluid buildup noted    Plan:     Reviewed condition and did 1 final injection right plantar fascia 3 mg Kenalog 5 mg Xylocaine and instructed on physical therapy. Reappoint to recheck

## 2015-04-25 ENCOUNTER — Encounter (HOSPITAL_COMMUNITY): Payer: Self-pay | Admitting: Emergency Medicine

## 2015-04-25 ENCOUNTER — Emergency Department (HOSPITAL_COMMUNITY): Payer: 59

## 2015-04-25 ENCOUNTER — Emergency Department (HOSPITAL_COMMUNITY)
Admission: EM | Admit: 2015-04-25 | Discharge: 2015-04-25 | Disposition: A | Discharge status: Home / Self Care | Payer: 59 | Attending: Emergency Medicine | Admitting: Emergency Medicine

## 2015-04-25 DIAGNOSIS — R0982 Postnasal drip: Secondary | ICD-10-CM | POA: Diagnosis not present

## 2015-04-25 DIAGNOSIS — Z87438 Personal history of other diseases of male genital organs: Secondary | ICD-10-CM | POA: Diagnosis not present

## 2015-04-25 DIAGNOSIS — Z79899 Other long term (current) drug therapy: Secondary | ICD-10-CM | POA: Insufficient documentation

## 2015-04-25 DIAGNOSIS — R1013 Epigastric pain: Secondary | ICD-10-CM | POA: Diagnosis not present

## 2015-04-25 DIAGNOSIS — Z8709 Personal history of other diseases of the respiratory system: Secondary | ICD-10-CM | POA: Insufficient documentation

## 2015-04-25 DIAGNOSIS — Z880 Allergy status to penicillin: Secondary | ICD-10-CM | POA: Insufficient documentation

## 2015-04-25 DIAGNOSIS — R112 Nausea with vomiting, unspecified: Secondary | ICD-10-CM | POA: Diagnosis not present

## 2015-04-25 DIAGNOSIS — E119 Type 2 diabetes mellitus without complications: Secondary | ICD-10-CM | POA: Insufficient documentation

## 2015-04-25 DIAGNOSIS — R0981 Nasal congestion: Secondary | ICD-10-CM | POA: Diagnosis not present

## 2015-04-25 DIAGNOSIS — Z9049 Acquired absence of other specified parts of digestive tract: Secondary | ICD-10-CM | POA: Insufficient documentation

## 2015-04-25 LAB — COMPREHENSIVE METABOLIC PANEL
ALBUMIN: 4.2 g/dL (ref 3.5–5.0)
ALK PHOS: 100 U/L (ref 38–126)
ALT: 62 U/L (ref 17–63)
AST: 61 U/L — ABNORMAL HIGH (ref 15–41)
Anion gap: 9 (ref 5–15)
BUN: 19 mg/dL (ref 6–20)
CALCIUM: 9.9 mg/dL (ref 8.9–10.3)
CO2: 28 mmol/L (ref 22–32)
CREATININE: 1.11 mg/dL (ref 0.61–1.24)
Chloride: 104 mmol/L (ref 101–111)
GFR calc Af Amer: >60 mL/min (ref >60)
GFR calc non Af Amer: >60 mL/min (ref >60)
GLUCOSE: 154 mg/dL — AB (ref 65–99)
Potassium: 3.9 mmol/L (ref 3.5–5.1)
SODIUM: 141 mmol/L (ref 135–145)
Total Bilirubin: 1 mg/dL (ref 0.3–1.2)
Total Protein: 7.6 g/dL (ref 6.5–8.1)

## 2015-04-25 LAB — CBC WITH DIFFERENTIAL/PLATELET
Basophils Absolute: 0 10*3/uL (ref 0.0–0.1)
Basophils Relative: 0 %
EOS ABS: 0.3 10*3/uL (ref 0.0–0.7)
Eosinophils Relative: 3 %
HCT: 43.2 % (ref 39.0–52.0)
Hemoglobin: 14.9 g/dL (ref 13.0–17.0)
LYMPHS ABS: 1.1 10*3/uL (ref 0.7–4.0)
Lymphocytes Relative: 14 %
MCH: 30.1 pg (ref 26.0–34.0)
MCHC: 34.5 g/dL (ref 30.0–36.0)
MCV: 87.3 fL (ref 78.0–100.0)
MONOS PCT: 8 %
Monocytes Absolute: 0.7 10*3/uL (ref 0.1–1.0)
Neutro Abs: 6.2 10*3/uL (ref 1.7–7.7)
Neutrophils Relative %: 75 %
Platelets: 196 10*3/uL (ref 150–400)
RBC: 4.95 MIL/uL (ref 4.22–5.81)
RDW: 12.1 % (ref 11.5–15.5)
WBC: 8.3 10*3/uL (ref 4.0–10.5)

## 2015-04-25 LAB — LIPASE, BLOOD: Lipase: 27 U/L (ref 11–51)

## 2015-04-25 MED ORDER — PANTOPRAZOLE SODIUM 20 MG PO TBEC: 20.0000 mg | DELAYED_RELEASE_TABLET | Freq: Every day | ORAL | Status: DC

## 2015-04-25 NOTE — Discharge Instructions (Signed)

## 2015-04-25 NOTE — ED Provider Notes (Signed)
CSN: [id number]     Arrival date & time 04/25/15  0019 History  By signing my name below, I, Guy [name], attest that this documentation has been prepared under the direction and in the presence of Orpah Greek, MD. Electronically Signed: Helane [name], ED Scribe. 04/25/2015. 2:54 AM.    Chief Complaint  Patient presents with  . Abdominal Pain   The history is provided by the patient. No language interpreter was used.   HPI Comments: Guy [name] is a 50 y.o. male with a PMHx of DM and a PSHx of appendectomy and hernia repair who presents to the Emergency Department complaining of constant, aching, epigastric pain radiating to the back onset a few hours ago. He notes the pain has been slowly improving since he arrived at the ED. Pt states he has had a similar episode about 2 months ago, which took about 1.5 days to resolve. He reports associated nausea and 1 episode of vomiting. He notes he has been burping a lot as well. He reports no relief of the pain either after vomiting or burping. He has taken Gas X and Alka Seltzer without relief. He states he had a cold this past week, which is now resolving, and states he still has some nasal congestion and drainage. He denies a PMHx of GERD, but notes his father had to have a cholecystectomy.    Past Medical History  Diagnosis Date  . Enlarged prostate   . Collapsed lung   . Diabetes mellitus without complication [facility])    Past Surgical History  Procedure Laterality Date  . Appendectomy    . Hernia repair     Family History  Problem Relation Age of Onset  . Diabetes Other    Social History  Substance Use Topics  . Smoking status: Never Smoker   . Smokeless tobacco: Never Used  . Alcohol Use: No    Review of Systems  HENT: Positive for congestion and postnasal drip.   Gastrointestinal: Positive for nausea, vomiting and abdominal pain.  All other systems reviewed and are negative.   Allergies  Penicillins and Sulfa  antibiotics  Home Medications   Prior to Admission medications   Medication Sig Start Date End Date Taking? Authorizing Provider  Blood Glucose Monitoring Suppl (BLOOD GLUCOSE METER) kit Use as instructed 06/26/13  Yes Virgel Manifold, MD  ibuprofen (ADVIL,MOTRIN) 200 MG tablet Take 200-400 mg by mouth every 8 (eight) hours as needed (for pain).   Yes Historical Provider, MD  Lancets Thin MISC 1 each by Does not apply route 4 (four) times daily -  before meals and at bedtime. 06/26/13  Yes Virgel Manifold, MD  diclofenac (VOLTAREN) 75 MG EC tablet Take 1 tablet (75 mg total) by mouth 2 (two) times daily. Patient not taking: Reported on 04/25/2015 07/10/14   Wallene Huh, DPM  metFORMIN (GLUCOPHAGE) 500 MG tablet Take 1 tablet (500 mg total) by mouth 2 (two) times daily with a meal. Patient not taking: Reported on 04/25/2015 06/26/13   Ankit Nanavati, MD   BP 168/108 mmHg  Pulse 80  Temp(Src) 98 F (36.7 C) (Oral)  Resp 16  Ht 5' 10"  (1.778 m)  Wt 195 lb (88.451 kg)  BMI 27.98 kg/m2  SpO2 100% Physical Exam  Constitutional: He is oriented to person, place, and time. He appears well-developed and well-nourished. No distress.  HENT:  Head: Normocephalic and atraumatic.  Right Ear: Hearing normal.  Left Ear: Hearing normal.  Nose: Nose normal.  Mouth/Throat:  Oropharynx is clear and moist and mucous membranes are normal.  Eyes: Conjunctivae and EOM are normal. Pupils are equal, round, and reactive to light.  Neck: Normal range of motion. Neck supple.  Cardiovascular: Regular rhythm, S1 normal and S2 normal.  Exam reveals no gallop and no friction rub.   No murmur heard. Pulmonary/Chest: Effort normal and breath sounds normal. No respiratory distress. He exhibits no tenderness.  Abdominal: Soft. Normal appearance and bowel sounds are normal. There is no hepatosplenomegaly. There is tenderness (epigastric). There is no rebound, no guarding, no tenderness at McBurney's point and negative Murphy's  sign. No hernia.  Musculoskeletal: Normal range of motion.  Neurological: He is alert and oriented to person, place, and time. He has normal strength. No cranial nerve deficit or sensory deficit. Coordination normal. GCS eye subscore is 4. GCS verbal subscore is 5. GCS motor subscore is 6.  Skin: Skin is warm, dry and intact. No rash noted. No cyanosis.  Psychiatric: He has a normal mood and affect. His speech is normal and behavior is normal. Thought content normal.  Nursing note and vitals reviewed.   ED Course  Procedures  DIAGNOSTIC STUDIES: Oxygen Saturation is 100% on RA, normal by my interpretation.    COORDINATION OF CARE: 2:52 AM - Discussed plans to order diagnostic studies and imaging. Pt advised of plan for treatment and pt agrees.  Labs Review Labs Reviewed  COMPREHENSIVE METABOLIC PANEL - Abnormal; Notable for the following:    Glucose, Bld 154 (*)    AST 61 (*)    All other components within normal limits  LIPASE, BLOOD  CBC WITH DIFFERENTIAL/PLATELET    Imaging Review No results found. I have personally reviewed and evaluated these images and lab results as part of my medical decision-making.   EKG Interpretation None      MDM   Final diagnoses:  None  abdominal pain  Presents with abdominal pain. Patient reports severe epigastric and mid abdominal discomfort after eating dinner. He has had one similar episode previously. He describes intermittent episodes of indigestion, but has never had pain like this. He had no improvement with Gas-X and antacids. Examination revealed epigastric discomfort and tenderness without guarding or rebound. Lab work was unremarkable. Patient underwent ultrasound to evaluate gallbladder. No gallstones or abnormalities are noted. Symptoms most likely secondary to acid reflux and/or gastritis. Will treat with proton pump inhibitor and follow-up with primary care doctor, possible referral to GI.  I personally performed the services  described in this documentation, which was scribed in my presence. The recorded information has been reviewed and is accurate.    Orpah Greek, MD 04/25/15 [phone]

## 2015-04-25 NOTE — ED Notes (Signed)
Pt states tonight after supper he started having abd pain  Pt sates the pain radiates into his back  Pt states he had one episode of vomiting  Pt states he took gas x and an alka seltzer without relief  Pt states he had the same kind of episode about 2 mths ago   Pt states he ate baked chicken, creamed potatoes, broccoli and ice cream for supper

## 2015-06-01 ENCOUNTER — Encounter (HOSPITAL_COMMUNITY): Payer: Self-pay | Admitting: *Deleted

## 2015-06-01 ENCOUNTER — Emergency Department (HOSPITAL_COMMUNITY)
Admission: EM | Admit: 2015-06-01 | Discharge: 2015-06-01 | Disposition: A | Discharge status: Home / Self Care | Payer: 59 | Attending: Emergency Medicine | Admitting: Emergency Medicine

## 2015-06-01 DIAGNOSIS — Y9289 Other specified places as the place of occurrence of the external cause: Secondary | ICD-10-CM | POA: Insufficient documentation

## 2015-06-01 DIAGNOSIS — Y999 Unspecified external cause status: Secondary | ICD-10-CM | POA: Diagnosis not present

## 2015-06-01 DIAGNOSIS — Z87438 Personal history of other diseases of male genital organs: Secondary | ICD-10-CM | POA: Insufficient documentation

## 2015-06-01 DIAGNOSIS — Z880 Allergy status to penicillin: Secondary | ICD-10-CM | POA: Insufficient documentation

## 2015-06-01 DIAGNOSIS — S6992XA Unspecified injury of left wrist, hand and finger(s), initial encounter: Secondary | ICD-10-CM | POA: Diagnosis present

## 2015-06-01 DIAGNOSIS — Z79899 Other long term (current) drug therapy: Secondary | ICD-10-CM | POA: Diagnosis not present

## 2015-06-01 DIAGNOSIS — Z8709 Personal history of other diseases of the respiratory system: Secondary | ICD-10-CM | POA: Diagnosis not present

## 2015-06-01 DIAGNOSIS — S61012A Laceration without foreign body of left thumb without damage to nail, initial encounter: Secondary | ICD-10-CM

## 2015-06-01 DIAGNOSIS — Y9389 Activity, other specified: Secondary | ICD-10-CM | POA: Insufficient documentation

## 2015-06-01 DIAGNOSIS — W270XXA Contact with workbench tool, initial encounter: Secondary | ICD-10-CM | POA: Insufficient documentation

## 2015-06-01 DIAGNOSIS — E119 Type 2 diabetes mellitus without complications: Secondary | ICD-10-CM | POA: Diagnosis not present

## 2015-06-01 MED ORDER — DOXYCYCLINE HYCLATE 100 MG PO CAPS: 100.0000 mg | ORAL_CAPSULE | Freq: Two times a day (BID) | ORAL | Status: DC

## 2015-06-01 MED ORDER — LIDOCAINE HCL 2 % IJ SOLN
10.0000 mL | Freq: Once | INTRAMUSCULAR | Status: AC
  Administered 2015-06-01: 200 mg via INTRADERMAL
  Filled 2015-06-01: qty 20

## 2015-06-01 NOTE — Discharge Instructions (Signed)
Mr. Guy [name],  Nice meeting you! Please follow-up with your primary care provider for suture removal in 10-14 days. Return to the emergency department if you develop fevers, chills, drainage (yellow/green), or increased pain. You may take ibuprofen at home for pain (600 mg four times a day). Feel better soon!  S. Lane Hacker, PA-C   Laceration Care, Adult A laceration is a cut that goes through all layers of the skin. The cut also goes into the tissue that is right under the skin. Some cuts heal on their own. Others need to be closed with stitches (sutures), staples, skin adhesive strips, or wound glue. Taking care of your cut lowers your risk of infection and helps your cut to heal better. HOW TO TAKE CARE OF YOUR CUT For stitches or staples:  Keep the wound clean and dry.  If you were given a bandage (dressing), you should change it at least one time per day or as told by your doctor. You should also change it if it gets wet or dirty.  Keep the wound completely dry for the first 24 hours or as told by your doctor. After that time, you may take a shower or a bath. However, make sure that the wound is not soaked in water until after the stitches or staples have been removed.  Clean the wound one time each day or as told by your doctor:  Wash the wound with soap and water.  Rinse the wound with water until all of the soap comes off.  Pat the wound dry with a clean towel. Do not rub the wound.  After you clean the wound, put a thin layer of antibiotic ointment on it as told by your doctor. This ointment:  Helps to prevent infection.  Keeps the bandage from sticking to the wound.  Have your stitches or staples removed as told by your doctor. If your doctor used skin adhesive strips:   Keep the wound clean and dry.  If you were given a bandage, you should change it at least one time per day or as told by your doctor. You should also change it if it gets dirty or wet.  Do  not get the skin adhesive strips wet. You can take a shower or a bath, but be careful to keep the wound dry.  If the wound gets wet, pat it dry with a clean towel. Do not rub the wound.  Skin adhesive strips fall off on their own. You can trim the strips as the wound heals. Do not remove any strips that are still stuck to the wound. They will fall off after a while. If your doctor used wound glue:  Try to keep your wound dry, but you may briefly wet it in the shower or bath. Do not soak the wound in water, such as by swimming.  After you take a shower or a bath, gently pat the wound dry with a clean towel. Do not rub the wound.  Do not do any activities that will make you really sweaty until the skin glue has fallen off on its own.  Do not apply liquid, cream, or ointment medicine to your wound while the skin glue is still on.  If you were given a bandage, you should change it at least one time per day or as told by your doctor. You should also change it if it gets dirty or wet.  If a bandage is placed over the wound, do not let  the tape for the bandage touch the skin glue.  Do not pick at the glue. The skin glue usually stays on for 5-10 days. Then, it falls off of the skin. General Instructions  To help prevent scarring, make sure to cover your wound with sunscreen whenever you are outside after stitches are removed, after adhesive strips are removed, or when wound glue stays in place and the wound is healed. Make sure to wear a sunscreen of at least 30 SPF.  Take over-the-counter and prescription medicines only as told by your doctor.  If you were given antibiotic medicine or ointment, take or apply it as told by your doctor. Do not stop using the antibiotic even if your wound is getting better.  Do not scratch or pick at the wound.  Keep all follow-up visits as told by your doctor. This is important.  Check your wound every day for signs of infection. Watch for:  Redness,  swelling, or pain.  Fluid, blood, or pus.  Raise (elevate) the injured area above the level of your heart while you are sitting or lying down, if possible. GET HELP IF:  You got a tetanus shot and you have any of these problems at the injection site:  Swelling.  Very bad pain.  Redness.  Bleeding.  You have a fever.  A wound that was closed breaks open.  You notice a bad smell coming from your wound or your bandage.  You notice something coming out of the wound, such as wood or glass.  Medicine does not help your pain.  You have more redness, swelling, or pain at the site of your wound.  You have fluid, blood, or pus coming from your wound.  You notice a change in the color of your skin near your wound.  You need to change the bandage often because fluid, blood, or pus is coming from the wound.  You start to have a new rash.  You start to have numbness around the wound. GET HELP RIGHT AWAY IF:  You have very bad swelling around the wound.  Your pain suddenly gets worse and is very bad.  You notice painful lumps near the wound or on skin that is anywhere on your body.  You have a red streak going away from your wound.  The wound is on your hand or foot and you cannot move a finger or toe like you usually can.  The wound is on your hand or foot and you notice that your fingers or toes look pale or bluish.   This information is not intended to replace advice given to you by your health care provider. Make sure you discuss any questions you have with your health care provider.   Document Released: 08/12/2007 Document Revised: 07/10/2014 Document Reviewed: 02/19/2014 Elsevier Interactive Patient Education [employer]2016 [employer] Inc[employer]

## 2015-06-01 NOTE — ED Notes (Signed)
Declined W/C at D/C and was escorted to lobby by RN. 

## 2015-06-01 NOTE — ED Provider Notes (Signed)
CSN: [id number]     Arrival date & time 06/01/15  1150 History  By signing my name below, I, Guy [name], attest that this documentation has been prepared under the direction and in the presence of Melton Krebs PA-C. Electronically Signed: Placido [name], ED Scribe. 06/01/2015. 2:00 PM.   Chief Complaint  Patient presents with  . Laceration   The history is provided by the patient. No language interpreter was used.   HPI Comments: Guy [name] is a 50 y.o. male with a PMHx of DM who presents to the Emergency Department complaining of a laceration with controlled bleeding to the distal end of his left thumb which occurred PTA. He notes working with a table saw and accidentally slipped resulting in his laceration. He reports associated, mild, pain surrounding the wound that worsens with palpation. He reports having a TDAP booster 1 year ago. He confirms his PMHx including DM noting that his daily blood glucose levels typically range from 80-100 mg/dL without the use of rx medications. Pt denies any other associated symptoms at this time.    Past Medical History  Diagnosis Date  . Enlarged prostate   . Collapsed lung   . Diabetes mellitus without complication [facility])    Past Surgical History  Procedure Laterality Date  . Appendectomy    . Hernia repair     Family History  Problem Relation Age of Onset  . Diabetes Other    Social History  Substance Use Topics  . Smoking status: Never Smoker   . Smokeless tobacco: Never Used  . Alcohol Use: No    Review of Systems A complete 10 system review of systems was obtained and all systems are negative except as noted in the HPI and PMH.   Allergies  Penicillins and Sulfa antibiotics  Home Medications   Prior to Admission medications   Medication Sig Start Date End Date Taking? Authorizing Provider  Blood Glucose Monitoring Suppl (BLOOD GLUCOSE METER) kit Use as instructed 06/26/13   Raeford Razor, MD  diclofenac  (VOLTAREN) 75 MG EC tablet Take 1 tablet (75 mg total) by mouth 2 (two) times daily. Patient not taking: Reported on 04/25/2015 07/10/14   Kirstie Peri Regal, DPM  ibuprofen (ADVIL,MOTRIN) 200 MG tablet Take 200-400 mg by mouth every 8 (eight) hours as needed (for pain).    Historical Provider, MD  Lancets Thin MISC 1 each by Does not apply route 4 (four) times daily -  before meals and at bedtime. 06/26/13   Raeford Razor, MD  metFORMIN (GLUCOPHAGE) 500 MG tablet Take 1 tablet (500 mg total) by mouth 2 (two) times daily with a meal. Patient not taking: Reported on 04/25/2015 06/26/13   Derwood Kaplan, MD  pantoprazole (PROTONIX) 20 MG tablet Take 1 tablet (20 mg total) by mouth daily. 04/25/15   Gilda Crease, MD   BP 138/84 mmHg  Pulse 90  Temp(Src) 97.6 F (36.4 C) (Oral)  Resp 18  Ht 5\' 10"  (1.778 m)  Wt 203 lb 3.2 oz (92.171 kg)  BMI 29.16 kg/m2  SpO2 100%    Physical Exam  Constitutional: He is oriented to person, place, and time. He appears well-developed and well-nourished.  HENT:  Head: Normocephalic and atraumatic.  Eyes: EOM are normal.  Neck: Normal range of motion.  Cardiovascular: Normal rate.   Pulmonary/Chest: Effort normal. No respiratory distress.  Abdominal: Soft.  Musculoskeletal: Normal range of motion.  NVI BL.   Neurological: He is alert and oriented to person, place,  and time.  Skin: Skin is warm and dry. Laceration noted.  3 cm laceration to left thumb, involving very distal portion of nail bed (nail bed intact) and minimal area of tuft.   Psychiatric: He has a normal mood and affect.  Nursing note and vitals reviewed.   ED Course  Procedures  DIAGNOSTIC STUDIES: Oxygen Saturation is 100% on RA, normal by my interpretation.    COORDINATION OF CARE: 1:12 PM Discussed next steps with pt. He verbalized understanding and is agreeable with the plan.   LACERATION REPAIR PROCEDURE NOTE The patient's identification was confirmed and consent was  obtained. This procedure was performed by [place] Lions, PA-C at 1:17 PM. Site: Left thumb Sterile procedures observed Anesthetic used (type and amt): lidocaine 2% without epinephrine; 5 mL Suture type/size: 5-0 proline Length: 3 cm # of Sutures: 5 Technique: simple interrupted  Complexity: complex Antibx ointment applied Tetanus UTD Site anesthetized, irrigated with NS, explored without evidence of foreign body, wound well approximated, site covered with dry, sterile dressing.  Patient tolerated procedure well without complications. Instructions for care discussed verbally and patient provided with additional written instructions for homecare and f/u.  MDM   Final diagnoses:  Thumb laceration, left, initial encounter   Pressure irrigation performed. Wound explored and base of wound visualized in a bloodless field without evidence of foreign body.  Laceration occurred < 8 hours prior to repair which was well tolerated. Tdap up to date.  Pt has diabetes that could affect normal wound healing, medication controlled. Pt discharged with doxycycline (allergic to PCN and sulfa).  Discussed suture home care with patient and answered questions. Pt to follow-up for wound check and suture removal in 10-14 days; they are to return to the ED sooner for signs of infection. Pt is hemodynamically stable with no complaints prior to dc.  I personally performed the services described in this documentation, which was scribed in my presence. The recorded information has been reviewed and is accurate.   [place] Lions, PA-C 06/05/15 [place], MD 06/08/15 0700

## 2015-06-01 NOTE — ED Notes (Signed)
Pt reports cutting the LT thumb on a table saw.

## 2016-02-04 ENCOUNTER — Ambulatory Visit: Payer: Self-pay | Admitting: Surgery

## 2016-02-04 ENCOUNTER — Other Ambulatory Visit: Payer: Self-pay | Admitting: Surgery

## 2016-02-04 DIAGNOSIS — R1031 Right lower quadrant pain: Secondary | ICD-10-CM

## 2016-02-10 ENCOUNTER — Ambulatory Visit
Admission: RE | Admit: 2016-02-10 | Discharge: 2016-02-10 | Disposition: A | Discharge status: Home / Self Care | Payer: 59 | Source: Ambulatory Visit | Attending: Surgery | Admitting: Surgery

## 2016-02-10 DIAGNOSIS — R1031 Right lower quadrant pain: Secondary | ICD-10-CM

## 2016-02-10 MED ORDER — IOPAMIDOL (ISOVUE-300) INJECTION 61%
100.0000 mL | Freq: Once | INTRAVENOUS | Status: AC | PRN
  Administered 2016-02-10: 100 mL via INTRAVENOUS

## 2017-10-12 ENCOUNTER — Encounter: Payer: Self-pay | Admitting: Podiatry

## 2017-10-12 ENCOUNTER — Ambulatory Visit (INDEPENDENT_AMBULATORY_CARE_PROVIDER_SITE_OTHER): Payer: 59 | Admitting: Podiatry

## 2017-10-12 DIAGNOSIS — M2042 Other hammer toe(s) (acquired), left foot: Secondary | ICD-10-CM | POA: Diagnosis not present

## 2017-10-12 DIAGNOSIS — L84 Corns and callosities: Secondary | ICD-10-CM

## 2017-10-12 DIAGNOSIS — E119 Type 2 diabetes mellitus without complications: Secondary | ICD-10-CM | POA: Diagnosis not present

## 2017-10-12 NOTE — Progress Notes (Signed)
This patient presents the office with chief complaint of a painful fifth toe left foot.  He says he has a painful area on the inside of the toenail fifth toe left foot which is painful walking and wearing his shoes.  He says he was treated himself by applying an acid pad.  He says that after the application of the acid pad  there is a hole that remains that is painful.  Upon examination of his fifth toe today reveals no evidence of any skin pathology.  Patient presents to the office stating he was hoping for definitive treatment such as cutting out the skin lesion.  He says he is applied acid twice and he desires definitive treatment of this painful lesion. He used a Q-tip to point to the painful site. Patient is diabetic.    General Appearance  Alert, conversant and in no acute stress.  Vascular  Dorsalis pedis and posterior tibial  pulses are palpable  bilaterally.  Capillary return is within normal limits  bilaterally. Temperature is within normal limits  bilaterally.  Neurologic  Senn-Weinstein monofilament wire test within normal limits  bilaterally. Muscle power within normal limits bilaterally.  Nails Thick disfigured discolored nails with subungual debris  from hallux to fifth toes bilaterally. No evidence of bacterial infection or drainage bilaterally.  Orthopedic  No limitations of motion of motion feet .  No crepitus or effusions noted.  Hammer toe 4/5 left foot which leads to underlapping fifth digit left foot.  Skin  normotropic skin with no porokeratosis noted bilaterally.  No signs of infections or ulcers noted.    Hammer toe 4,5 fifth toe left foot.  IE.  After examination of his fifth toe. I was unable to noted any skin pathology.  Perhaps he had used the acid, which had removed the skin lesion.  Based on his description, I recommended he wear a padding to separate the fourth and fifth toes in his shoes.  Patient was not very pleased believing a definitive treatment would be  performed.  Since there was no evidence of any skin pathology. I had no treatment to offer.  I did tell him that if he wants to have surgical correction he needs to be evaluated by  one of the surgical doctors in the practice.   Helane GuntherGregory Abbygale Lapid DPM

## 2018-07-20 DIAGNOSIS — M7542 Impingement syndrome of left shoulder: Secondary | ICD-10-CM | POA: Insufficient documentation

## 2018-07-20 DIAGNOSIS — M7582 Other shoulder lesions, left shoulder: Secondary | ICD-10-CM | POA: Insufficient documentation

## 2018-07-20 DIAGNOSIS — S46012A Strain of muscle(s) and tendon(s) of the rotator cuff of left shoulder, initial encounter: Secondary | ICD-10-CM | POA: Insufficient documentation

## 2018-11-28 ENCOUNTER — Other Ambulatory Visit: Payer: Self-pay

## 2018-11-28 ENCOUNTER — Ambulatory Visit (INDEPENDENT_AMBULATORY_CARE_PROVIDER_SITE_OTHER): Payer: 59 | Admitting: Podiatry

## 2018-11-28 DIAGNOSIS — W57XXXA Bitten or stung by nonvenomous insect and other nonvenomous arthropods, initial encounter: Secondary | ICD-10-CM | POA: Diagnosis not present

## 2018-11-28 DIAGNOSIS — S90862A Insect bite (nonvenomous), left foot, initial encounter: Secondary | ICD-10-CM | POA: Diagnosis not present

## 2018-11-28 MED ORDER — BETAMETHASONE DIPROPIONATE 0.05 % EX CREA: TOPICAL_CREAM | Freq: Two times a day (BID) | CUTANEOUS | 0 refills | Status: DC

## 2018-11-28 MED ORDER — GENTAMICIN SULFATE 0.1 % EX CREA: 1.0000 "application " | TOPICAL_CREAM | Freq: Two times a day (BID) | CUTANEOUS | 1 refills | Status: DC

## 2018-12-03 NOTE — Progress Notes (Signed)
   HPI: 53 y.o. male presenting today with a chief complaint of pain to the left plantar arch secondary to lesions that appeared two days ago. He denies any known trauma or injury. He denies drainage. He has not done anything for treatment. Walking and bearing weight increases the pain. Patient is here for further evaluation and treatment.   Past Medical History:  Diagnosis Date  . Collapsed lung   . Diabetes mellitus without complication ([place])   . Enlarged prostate      Physical Exam: General: The patient is alert and oriented x3 in no acute distress.  Dermatology: Multiple nonpainful intact vesicular lesions noted along arch of left foot. Skin is warm, dry and supple bilateral lower extremities. Negative for open lesions or macerations.  Vascular: Palpable pedal pulses bilaterally. No edema or erythema noted. Capillary refill within normal limits.  Neurological: Epicritic and protective threshold grossly intact bilaterally.   Musculoskeletal Exam: Range of motion within normal limits to all pedal and ankle joints bilateral. Muscle strength 5/5 in all groups bilateral.   Assessment: 1. Possible insect bite left foot   Plan of Care:  1. Patient evaluated.   2. Prescription for Gentamicin cream provided to patient to use daily with a bandage.  3. Prescription for Betamethasone cream provided to patient.  4. Combine the two creams and apply twice daily.  5. Return to clinic in 3 weeks.   Goes by Guy [name].       Edrick Kins, DPM [facility]  Dr. Edrick Kins, DPM    [address], [place] [address]                Office [phone]  Fax [phone]

## 2019-12-21 ENCOUNTER — Other Ambulatory Visit: Payer: Self-pay

## 2019-12-21 ENCOUNTER — Emergency Department (HOSPITAL_COMMUNITY)

## 2019-12-21 ENCOUNTER — Emergency Department (HOSPITAL_COMMUNITY)
Admission: EM | Admit: 2019-12-21 | Discharge: 2019-12-21 | Disposition: A | Discharge status: Home / Self Care | Attending: Emergency Medicine | Admitting: Emergency Medicine

## 2019-12-21 ENCOUNTER — Encounter (HOSPITAL_COMMUNITY): Payer: Self-pay | Admitting: *Deleted

## 2019-12-21 DIAGNOSIS — S39011A Strain of muscle, fascia and tendon of abdomen, initial encounter: Secondary | ICD-10-CM | POA: Diagnosis not present

## 2019-12-21 DIAGNOSIS — Z87891 Personal history of nicotine dependence: Secondary | ICD-10-CM | POA: Diagnosis not present

## 2019-12-21 DIAGNOSIS — E119 Type 2 diabetes mellitus without complications: Secondary | ICD-10-CM | POA: Diagnosis not present

## 2019-12-21 DIAGNOSIS — S3991XA Unspecified injury of abdomen, initial encounter: Secondary | ICD-10-CM | POA: Diagnosis present

## 2019-12-21 DIAGNOSIS — Y9389 Activity, other specified: Secondary | ICD-10-CM | POA: Diagnosis not present

## 2019-12-21 DIAGNOSIS — X500XXA Overexertion from strenuous movement or load, initial encounter: Secondary | ICD-10-CM | POA: Insufficient documentation

## 2019-12-21 DIAGNOSIS — Z7984 Long term (current) use of oral hypoglycemic drugs: Secondary | ICD-10-CM | POA: Diagnosis not present

## 2019-12-21 DIAGNOSIS — Y990 Civilian activity done for income or pay: Secondary | ICD-10-CM | POA: Insufficient documentation

## 2019-12-21 LAB — CBC WITH DIFFERENTIAL/PLATELET
Abs Immature Granulocytes: 0.09 10*3/uL — ABNORMAL HIGH (ref 0.00–0.07)
Basophils Absolute: 0.1 10*3/uL (ref 0.0–0.1)
Basophils Relative: 1 %
Eosinophils Absolute: 0.4 10*3/uL (ref 0.0–0.5)
Eosinophils Relative: 4 %
HCT: 43.7 % (ref 39.0–52.0)
Hemoglobin: 15 g/dL (ref 13.0–17.0)
Immature Granulocytes: 1 %
Lymphocytes Relative: 15 %
Lymphs Abs: 1.3 10*3/uL (ref 0.7–4.0)
MCH: 30.8 pg (ref 26.0–34.0)
MCHC: 34.3 g/dL (ref 30.0–36.0)
MCV: 89.7 fL (ref 80.0–100.0)
Monocytes Absolute: 0.7 10*3/uL (ref 0.1–1.0)
Monocytes Relative: 9 %
Neutro Abs: 5.9 10*3/uL (ref 1.7–7.7)
Neutrophils Relative %: 70 %
Platelets: 208 10*3/uL (ref 150–400)
RBC: 4.87 MIL/uL (ref 4.22–5.81)
RDW: 12.3 % (ref 11.5–15.5)
WBC: 8.4 10*3/uL (ref 4.0–10.5)
nRBC: 0 % (ref 0.0–0.2)

## 2019-12-21 LAB — COMPREHENSIVE METABOLIC PANEL
ALT: 27 U/L (ref 0–44)
AST: 27 U/L (ref 15–41)
Albumin: 4.1 g/dL (ref 3.5–5.0)
Alkaline Phosphatase: 81 U/L (ref 38–126)
Anion gap: 8 (ref 5–15)
BUN: 18 mg/dL (ref 6–20)
CO2: 29 mmol/L (ref 22–32)
Calcium: 9.2 mg/dL (ref 8.9–10.3)
Chloride: 97 mmol/L — ABNORMAL LOW (ref 98–111)
Creatinine, Ser: 1.26 mg/dL — ABNORMAL HIGH (ref 0.61–1.24)
GFR, Estimated: >60 mL/min (ref >60)
Glucose, Bld: 107 mg/dL — ABNORMAL HIGH (ref 70–99)
Potassium: 3.9 mmol/L (ref 3.5–5.1)
Sodium: 134 mmol/L — ABNORMAL LOW (ref 135–145)
Total Bilirubin: 1 mg/dL (ref 0.3–1.2)
Total Protein: 6.9 g/dL (ref 6.5–8.1)

## 2019-12-21 LAB — URINALYSIS, ROUTINE W REFLEX MICROSCOPIC
Bacteria, UA: NONE SEEN
Bilirubin Urine: NEGATIVE
Glucose, UA: NEGATIVE mg/dL
Hgb urine dipstick: NEGATIVE
Ketones, ur: NEGATIVE mg/dL
Leukocytes,Ua: NEGATIVE
Nitrite: NEGATIVE
Protein, ur: 30 mg/dL — AB
Specific Gravity, Urine: 1.014 (ref 1.005–1.030)
pH: 5 (ref 5.0–8.0)

## 2019-12-21 MED ORDER — CYCLOBENZAPRINE HCL 10 MG PO TABS: 10.0000 mg | ORAL_TABLET | Freq: Two times a day (BID) | ORAL | 0 refills | Status: DC | PRN

## 2019-12-21 MED ORDER — IOHEXOL 300 MG/ML  SOLN
100.0000 mL | Freq: Once | INTRAMUSCULAR | Status: AC | PRN
  Administered 2019-12-21: 100 mL via INTRAVENOUS

## 2019-12-21 MED ORDER — SODIUM CHLORIDE (PF) 0.9 % IJ SOLN
INTRAMUSCULAR | Status: AC
  Filled 2019-12-21: qty 50

## 2019-12-21 NOTE — ED Triage Notes (Signed)
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 Afterward RLQ pain, seen at [facility] and sent here to r/o hernia.

## 2019-12-21 NOTE — ED Provider Notes (Signed)
[facility] EMERGENCY DEPARTMENT Provider Note  CSN: [id number] Arrival date & time: 12/21/19 1008    History Chief Complaint  Patient presents with  . Abdominal Pain    HPI  Guy [name] is a 54 y.o. male here for evaluation of RLQ pain. He reports sudden onset of sharp RLQ pain yesterday at work. He had been doing some lifting earlier in the day, but nothing heavy and no pain while lifting. Pain occasional radiates into R flank, but no dysuria or hematuria. He has noticed pain is worse with certain movements. He thought he noticed a lump in his R groin last night. Denies fever, N/V/D or constipation. He had an open appendectomy as a child. He was seen at [facility] and sent to the ED for concerns of a hernia.     Past Medical History:  Diagnosis Date  . Collapsed lung   . Diabetes mellitus without complication ([place])   . Enlarged prostate     Past Surgical History:  Procedure Laterality Date  . APPENDECTOMY    . HERNIA REPAIR      Family History  Problem Relation Age of Onset  . Diabetes Other     Social History   Tobacco Use  . Smoking status: Former Research scientist (life sciences)  . Smokeless tobacco: Never Used  Substance Use Topics  . Alcohol use: No  . Drug use: No     Home Medications Prior to Admission medications   Medication Sig Start Date End Date Taking? Authorizing Provider  Aspirin-Acetaminophen-Caffeine (GOODY HEADACHE PO) Take 1 packet by mouth as needed (headache/pain).   Yes [provider]  ibuprofen (ADVIL,MOTRIN) 200 MG tablet Take 200-400 mg by mouth every 8 (eight) hours as needed (for pain).   Yes [provider]  Blood Glucose Monitoring Suppl (BLOOD GLUCOSE METER) kit Use as instructed 06/26/13   Virgel Manifold, MD  cyclobenzaprine (FLEXERIL) 10 MG tablet Take 1 tablet (10 mg total) by mouth 2 (two) times daily as needed for muscle spasms. 12/21/19   Truddie Hidden, MD  Lancets Thin MISC 1 each by Does not apply route 4 (four) times daily  -  before meals and at bedtime. 06/26/13   Virgel Manifold, MD  metFORMIN (GLUCOPHAGE) 500 MG tablet Take 1 tablet (500 mg total) by mouth 2 (two) times daily with a meal. Patient not taking: Reported on 12/21/2019 06/26/13 12/21/19  Varney Biles, MD  pantoprazole (PROTONIX) 20 MG tablet Take 1 tablet (20 mg total) by mouth daily. Patient not taking: Reported on 12/21/2019 04/25/15 12/21/19  Orpah Greek, MD     Allergies    Penicillins and Sulfa antibiotics   Review of Systems   Review of Systems A comprehensive review of systems was completed and negative except as noted in HPI.    Physical Exam BP (!) 146/93   Pulse 65   Temp 98.2 F (36.8 C) (Oral)   Resp 16   Ht $R'5\' 10"'eF$  (1.778 m)   Wt 89.8 kg   SpO2 99%   BMI 28.41 kg/m   Physical Exam Vitals and nursing note reviewed.  Constitutional:      Appearance: Normal appearance.  HENT:     Head: Normocephalic and atraumatic.     Nose: Nose normal.     Mouth/Throat:     Mouth: Mucous membranes are moist.  Eyes:     Extraocular Movements: Extraocular movements intact.     Conjunctiva/sclera: Conjunctivae normal.  Cardiovascular:     Rate and Rhythm: Normal  rate.  Pulmonary:     Effort: Pulmonary effort is normal.     Breath sounds: Normal breath sounds.  Abdominal:     General: Abdomen is flat. A surgical scar is present.     Palpations: Abdomen is soft.     Tenderness: There is abdominal tenderness in the right lower quadrant. There is no guarding. Negative signs include Murphy's sign.     Hernia: No hernia (None appreciated) is present.  Musculoskeletal:        General: No swelling. Normal range of motion.     Cervical back: Neck supple.  Skin:    General: Skin is warm and dry.  Neurological:     General: No focal deficit present.     Mental Status: He is alert.  Psychiatric:        Mood and Affect: Mood normal.      ED Results / Procedures / Treatments   Labs (all labs ordered are listed, but  only abnormal results are displayed) Labs Reviewed  COMPREHENSIVE METABOLIC PANEL - Abnormal; Notable for the following components:      Result Value   Sodium 134 (*)    Chloride 97 (*)    Glucose, Bld 107 (*)    Creatinine, Ser 1.26 (*)    All other components within normal limits  CBC WITH DIFFERENTIAL/PLATELET - Abnormal; Notable for the following components:   Abs Immature Granulocytes 0.09 (*)    All other components within normal limits  URINALYSIS, ROUTINE W REFLEX MICROSCOPIC - Abnormal; Notable for the following components:   Protein, ur 30 (*)    All other components within normal limits    EKG None  Radiology CT Abdomen Pelvis W Contrast  Result Date: 12/21/2019 CLINICAL DATA:  Acute right lower quadrant abdominal pain. EXAM: CT ABDOMEN AND PELVIS WITH CONTRAST TECHNIQUE: Multidetector CT imaging of the abdomen and pelvis was performed using the standard protocol following bolus administration of intravenous contrast. CONTRAST:  143m OMNIPAQUE IOHEXOL 300 MG/ML  SOLN COMPARISON:  February 10, 2016. FINDINGS: Lower chest: No acute abnormality. Hepatobiliary: Mild cholelithiasis is noted. No biliary dilatation is noted. The liver is unremarkable. Pancreas: Unremarkable. No pancreatic ductal dilatation or surrounding inflammatory changes. Spleen: Normal in size without focal abnormality. Adrenals/Urinary Tract: Adrenal glands appear normal. Right renal cysts are noted. No hydronephrosis or renal obstruction is noted. No renal or ureteral calculi are noted. Urinary bladder is unremarkable. Stomach/Bowel: The stomach appears normal. There is no evidence of bowel obstruction or inflammation. Sigmoid diverticulosis is noted without inflammation. Status post appendectomy. Vascular/Lymphatic: Aortic atherosclerosis. No enlarged abdominal or pelvic lymph nodes. Reproductive: Prostate is unremarkable. Other: No abdominal wall hernia or abnormality. No abdominopelvic ascites. Musculoskeletal:  Grade 1 anterolisthesis of L5-S1 is noted secondary to bilateral L5 spondylolysis. No acute osseous abnormality is noted. IMPRESSION: 1. Mild cholelithiasis. 2. Sigmoid diverticulosis without inflammation. 3. Grade 1 anterolisthesis of L5-S1 secondary to bilateral L5 spondylolysis. 4. No acute abnormality seen in the abdomen or pelvis. 5. Aortic atherosclerosis. Aortic Atherosclerosis (ICD10-I70.0). Electronically Signed   By: JMarijo ConceptionM.D.   On: 12/21/2019 12:13    Procedures Procedures  Medications Ordered in the ED Medications  iohexol (OMNIPAQUE) 300 MG/ML solution 100 mL (100 mLs Intravenous Contrast Given 12/21/19 1138)  sodium chloride (PF) 0.9 % injection (  Given 12/21/19 1149)     MDM Rules/Calculators/A&P MDM Patient with RLQ pain, has had prior appendectomy. Consider kidney stone vs hernia. Send for CT. Could also be simple MSK  pain.  ED Course  I have reviewed the triage vital signs and the nursing notes.  Pertinent labs & imaging results that were available during my care of the patient were reviewed by me and considered in my medical decision making (see chart for details).  Clinical Course as of Dec 20 1232  Thu Dec 21, 2019  1127 CBC, CMP and UA are neg.    [CS]  2550 CT neg for acute process. Patient aware of gall stones. Pain is likely MSK. Plan discharge home with rest. Trial of muscle relaxer at night and PCP follow up.    [CS]    Clinical Course User Index [CS] Truddie Hidden, MD    Final Clinical Impression(s) / ED Diagnoses Final diagnoses:  Strain of abdominal muscle, initial encounter    Rx / DC Orders ED Discharge Orders         Ordered    cyclobenzaprine (FLEXERIL) 10 MG tablet  2 times daily PRN        12/21/19 1234           Truddie Hidden, MD 12/21/19 1234

## 2020-01-20 ENCOUNTER — Emergency Department (HOSPITAL_COMMUNITY): Payer: 59

## 2020-01-20 ENCOUNTER — Encounter (HOSPITAL_COMMUNITY): Payer: Self-pay | Admitting: Emergency Medicine

## 2020-01-20 ENCOUNTER — Other Ambulatory Visit: Payer: Self-pay

## 2020-01-20 ENCOUNTER — Inpatient Hospital Stay (HOSPITAL_COMMUNITY)
Admission: EM | Admit: 2020-01-20 | Discharge: 2020-01-23 | DRG: 247 | Disposition: A | Discharge status: Home / Self Care | Payer: 59 | Attending: Internal Medicine | Admitting: Internal Medicine

## 2020-01-20 DIAGNOSIS — IMO0002 Reserved for concepts with insufficient information to code with codable children: Secondary | ICD-10-CM

## 2020-01-20 DIAGNOSIS — Z79899 Other long term (current) drug therapy: Secondary | ICD-10-CM

## 2020-01-20 DIAGNOSIS — I249 Acute ischemic heart disease, unspecified: Secondary | ICD-10-CM | POA: Diagnosis present

## 2020-01-20 DIAGNOSIS — E119 Type 2 diabetes mellitus without complications: Secondary | ICD-10-CM | POA: Diagnosis present

## 2020-01-20 DIAGNOSIS — N400 Enlarged prostate without lower urinary tract symptoms: Secondary | ICD-10-CM | POA: Diagnosis present

## 2020-01-20 DIAGNOSIS — I214 Non-ST elevation (NSTEMI) myocardial infarction: Principal | ICD-10-CM | POA: Diagnosis present

## 2020-01-20 DIAGNOSIS — Z87891 Personal history of nicotine dependence: Secondary | ICD-10-CM | POA: Diagnosis not present

## 2020-01-20 DIAGNOSIS — E7800 Pure hypercholesterolemia, unspecified: Secondary | ICD-10-CM | POA: Diagnosis not present

## 2020-01-20 DIAGNOSIS — Z7984 Long term (current) use of oral hypoglycemic drugs: Secondary | ICD-10-CM

## 2020-01-20 DIAGNOSIS — K219 Gastro-esophageal reflux disease without esophagitis: Secondary | ICD-10-CM | POA: Diagnosis present

## 2020-01-20 DIAGNOSIS — E1165 Type 2 diabetes mellitus with hyperglycemia: Secondary | ICD-10-CM

## 2020-01-20 DIAGNOSIS — E785 Hyperlipidemia, unspecified: Secondary | ICD-10-CM | POA: Diagnosis present

## 2020-01-20 DIAGNOSIS — N179 Acute kidney failure, unspecified: Secondary | ICD-10-CM | POA: Diagnosis present

## 2020-01-20 DIAGNOSIS — E118 Type 2 diabetes mellitus with unspecified complications: Secondary | ICD-10-CM | POA: Diagnosis not present

## 2020-01-20 DIAGNOSIS — I10 Essential (primary) hypertension: Secondary | ICD-10-CM

## 2020-01-20 DIAGNOSIS — R079 Chest pain, unspecified: Secondary | ICD-10-CM | POA: Diagnosis not present

## 2020-01-20 DIAGNOSIS — I200 Unstable angina: Secondary | ICD-10-CM

## 2020-01-20 DIAGNOSIS — Z955 Presence of coronary angioplasty implant and graft: Secondary | ICD-10-CM

## 2020-01-20 DIAGNOSIS — R0789 Other chest pain: Secondary | ICD-10-CM | POA: Diagnosis present

## 2020-01-20 DIAGNOSIS — Z7982 Long term (current) use of aspirin: Secondary | ICD-10-CM | POA: Diagnosis not present

## 2020-01-20 DIAGNOSIS — Z20822 Contact with and (suspected) exposure to covid-19: Secondary | ICD-10-CM | POA: Diagnosis present

## 2020-01-20 DIAGNOSIS — I2510 Atherosclerotic heart disease of native coronary artery without angina pectoris: Secondary | ICD-10-CM | POA: Diagnosis present

## 2020-01-20 HISTORY — DX: Essential (primary) hypertension: I10

## 2020-01-20 HISTORY — DX: Other cervical disc degeneration, unspecified cervical region: M50.30

## 2020-01-20 LAB — GLUCOSE, CAPILLARY
Glucose-Capillary: 110 mg/dL — ABNORMAL HIGH (ref 70–99)
Glucose-Capillary: 114 mg/dL — ABNORMAL HIGH (ref 70–99)

## 2020-01-20 LAB — CBC
HCT: 44.8 % (ref 39.0–52.0)
Hemoglobin: 15.7 g/dL (ref 13.0–17.0)
MCH: 31.4 pg (ref 26.0–34.0)
MCHC: 35 g/dL (ref 30.0–36.0)
MCV: 89.6 fL (ref 80.0–100.0)
Platelets: 187 10*3/uL (ref 150–400)
RBC: 5 MIL/uL (ref 4.22–5.81)
RDW: 12.2 % (ref 11.5–15.5)
WBC: 9.2 10*3/uL (ref 4.0–10.5)
nRBC: 0 % (ref 0.0–0.2)

## 2020-01-20 LAB — LIPID PANEL
Cholesterol: 171 mg/dL (ref 0–200)
HDL: 28 mg/dL — ABNORMAL LOW (ref >40)
LDL Cholesterol: 126 mg/dL — ABNORMAL HIGH (ref 0–99)
Total CHOL/HDL Ratio: 6.1 RATIO
Triglycerides: 87 mg/dL (ref <150)
VLDL: 17 mg/dL (ref 0–40)

## 2020-01-20 LAB — BASIC METABOLIC PANEL
Anion gap: 8 (ref 5–15)
BUN: 15 mg/dL (ref 6–20)
CO2: 30 mmol/L (ref 22–32)
Calcium: 9.8 mg/dL (ref 8.9–10.3)
Chloride: 99 mmol/L (ref 98–111)
Creatinine, Ser: 1.27 mg/dL — ABNORMAL HIGH (ref 0.61–1.24)
GFR, Estimated: >60 mL/min (ref >60)
Glucose, Bld: 123 mg/dL — ABNORMAL HIGH (ref 70–99)
Potassium: 3.8 mmol/L (ref 3.5–5.1)
Sodium: 137 mmol/L (ref 135–145)

## 2020-01-20 LAB — HEMOGLOBIN A1C
Hgb A1c MFr Bld: 5.7 % — ABNORMAL HIGH (ref 4.8–5.6)
Mean Plasma Glucose: 116.89 mg/dL

## 2020-01-20 LAB — RESPIRATORY PANEL BY RT PCR (FLU A&B, COVID)
Influenza A by PCR: NEGATIVE
Influenza B by PCR: NEGATIVE
SARS Coronavirus 2 by RT PCR: NEGATIVE

## 2020-01-20 LAB — PROTIME-INR
INR: 1.2 (ref 0.8–1.2)
Prothrombin Time: 14.5 seconds (ref 11.4–15.2)

## 2020-01-20 LAB — HEPARIN LEVEL (UNFRACTIONATED)
Heparin Unfractionated: 0.76 IU/mL — ABNORMAL HIGH (ref 0.30–0.70)
Heparin Unfractionated: 0.27 IU/mL — ABNORMAL LOW (ref 0.30–0.70)

## 2020-01-20 LAB — TROPONIN I (HIGH SENSITIVITY)
Troponin I (High Sensitivity): 390 ng/L (ref <18)
Troponin I (High Sensitivity): 774 ng/L (ref <18)

## 2020-01-20 LAB — APTT: aPTT: >200 seconds (ref 24–36)

## 2020-01-20 MED ORDER — ASPIRIN EC 81 MG PO TBEC
81.0000 mg | DELAYED_RELEASE_TABLET | Freq: Every day | ORAL | Status: DC
  Administered 2020-01-21 – 2020-01-23 (×3): 81 mg via ORAL
  Filled 2020-01-20 (×4): qty 1

## 2020-01-20 MED ORDER — ACETAMINOPHEN 325 MG PO TABS
650.0000 mg | ORAL_TABLET | ORAL | Status: DC | PRN
  Administered 2020-01-20 – 2020-01-22 (×3): 650 mg via ORAL
  Filled 2020-01-20 (×3): qty 2

## 2020-01-20 MED ORDER — HEPARIN (PORCINE) 25000 UT/250ML-% IV SOLN
1300.0000 [IU]/h | INTRAVENOUS | Status: DC
  Administered 2020-01-20: 1300 [IU]/h via INTRAVENOUS
  Administered 2020-01-20: 1200 [IU]/h via INTRAVENOUS
  Administered 2020-01-21: 1300 [IU]/h via INTRAVENOUS
  Filled 2020-01-20 (×3): qty 250

## 2020-01-20 MED ORDER — SODIUM CHLORIDE 0.9 % WEIGHT BASED INFUSION: 1.0000 mL/kg/h | INTRAVENOUS | Status: DC

## 2020-01-20 MED ORDER — SODIUM CHLORIDE 0.9% FLUSH: 3.0000 mL | INTRAVENOUS | Status: DC | PRN

## 2020-01-20 MED ORDER — INSULIN ASPART 100 UNIT/ML ~~LOC~~ SOLN: 0.0000 [IU] | Freq: Every day | SUBCUTANEOUS | Status: DC

## 2020-01-20 MED ORDER — NITROGLYCERIN IN D5W 200-5 MCG/ML-% IV SOLN
0.0000 ug/min | INTRAVENOUS | Status: DC
  Administered 2020-01-20: 5 ug/min via INTRAVENOUS
  Filled 2020-01-20: qty 250

## 2020-01-20 MED ORDER — SODIUM CHLORIDE 0.9% FLUSH
3.0000 mL | INTRAVENOUS | Status: DC | PRN
  Administered 2020-01-23: 3 mL via INTRAVENOUS

## 2020-01-20 MED ORDER — SODIUM CHLORIDE 0.9% FLUSH
3.0000 mL | Freq: Two times a day (BID) | INTRAVENOUS | Status: DC
  Administered 2020-01-20 – 2020-01-21 (×2): 3 mL via INTRAVENOUS

## 2020-01-20 MED ORDER — SODIUM CHLORIDE 0.9% FLUSH
3.0000 mL | Freq: Two times a day (BID) | INTRAVENOUS | Status: DC
  Administered 2020-01-20 – 2020-01-22 (×3): 3 mL via INTRAVENOUS

## 2020-01-20 MED ORDER — INSULIN ASPART 100 UNIT/ML ~~LOC~~ SOLN: 0.0000 [IU] | Freq: Three times a day (TID) | SUBCUTANEOUS | Status: DC

## 2020-01-20 MED ORDER — SODIUM CHLORIDE 0.9 % IV SOLN: 250.0000 mL | INTRAVENOUS | Status: DC | PRN

## 2020-01-20 MED ORDER — ATORVASTATIN CALCIUM 80 MG PO TABS
80.0000 mg | ORAL_TABLET | Freq: Every day | ORAL | Status: DC
  Administered 2020-01-20 – 2020-01-23 (×4): 80 mg via ORAL
  Filled 2020-01-20: qty 1
  Filled 2020-01-20: qty 2
  Filled 2020-01-20: qty 1
  Filled 2020-01-20: qty 2

## 2020-01-20 MED ORDER — METOPROLOL TARTRATE 25 MG PO TABS
25.0000 mg | ORAL_TABLET | Freq: Two times a day (BID) | ORAL | Status: DC
  Administered 2020-01-20 (×2): 25 mg via ORAL
  Filled 2020-01-20 (×2): qty 1

## 2020-01-20 MED ORDER — LISINOPRIL 10 MG PO TABS
5.0000 mg | ORAL_TABLET | Freq: Every day | ORAL | Status: DC
  Administered 2020-01-20: 5 mg via ORAL
  Filled 2020-01-20: qty 1

## 2020-01-20 MED ORDER — HEPARIN BOLUS VIA INFUSION
4000.0000 [IU] | Freq: Once | INTRAVENOUS | Status: AC
  Administered 2020-01-20: 4000 [IU] via INTRAVENOUS
  Filled 2020-01-20: qty 4000

## 2020-01-20 MED ORDER — ASPIRIN 81 MG PO CHEW
324.0000 mg | CHEWABLE_TABLET | Freq: Once | ORAL | Status: AC
  Administered 2020-01-20: 324 mg via ORAL
  Filled 2020-01-20: qty 4

## 2020-01-20 MED ORDER — ONDANSETRON HCL 4 MG/2ML IJ SOLN: 4.0000 mg | Freq: Four times a day (QID) | INTRAMUSCULAR | Status: DC | PRN

## 2020-01-20 MED ORDER — NITROGLYCERIN 0.4 MG SL SUBL
0.4000 mg | SUBLINGUAL_TABLET | SUBLINGUAL | Status: DC | PRN
  Administered 2020-01-20: 0.4 mg via SUBLINGUAL
  Filled 2020-01-20: qty 1

## 2020-01-20 MED ORDER — NITROGLYCERIN 0.4 MG SL SUBL: 0.4000 mg | SUBLINGUAL_TABLET | SUBLINGUAL | Status: DC | PRN

## 2020-01-20 NOTE — ED Notes (Signed)
Date and time results received: 01/20/20 0925 (use smartphrase ".now" to insert current time)  Test PTT Critical Value: above 200  Name of Provider Notified: Allena Katz  Orders Received? Or Actions Taken?:

## 2020-01-20 NOTE — Progress Notes (Signed)
ANTICOAGULATION CONSULT NOTE  Pharmacy Consult for heparin Indication: ACS/STEMI  Allergies  Allergen Reactions  . Penicillins Other (See Comments)    Patient states his joints felt like they were locking up  Has patient had a PCN reaction causing immediate rash, facial/tongue/throat swelling, SOB or lightheadedness with hypotension: no Has patient had a PCN reaction causing severe rash involving mucus membranes or skin necrosis: no Has patient had a PCN reaction that required hospitalization: no Has patient had a PCN reaction occurring within the last 10 years: no If all of the above answers are "NO", then may proceed with Cephalosporin use.   . Sulfa Antibiotics Other (See Comments)    Patient is unsure of allergy    Patient Measurements: Height: 5\' 10"  (177.8 cm) Weight: 90.3 kg (199 lb 1.6 oz) IBW/kg (Calculated) : 73 Heparin Dosing Weight: 98.9  Vital Signs: Temp: 98.8 F (37.1 C) (11/13 1542) Temp Source: Oral (11/13 1542) BP: 163/95 (11/13 1542) Pulse Rate: 82 (11/13 1542)  Labs: Recent Labs    01/20/20 0619 01/20/20 0754 01/20/20 0819 01/20/20 0900 01/20/20 1824  HGB 15.7  --   --   --   --   HCT 44.8  --   --   --   --   PLT 187  --   --   --   --   APTT  --  >200*  --   --   --   LABPROT  --  14.5  --   --   --   INR  --  1.2  --   --   --   HEPARINUNFRC  --   --   --  0.76* 0.27*  CREATININE 1.27*  --   --   --   --   TROPONINIHS 390*  --  774*  --   --     Estimated Creatinine Clearance: 75.1 mL/min (A) (by C-G formula based on SCr of 1.27 mg/dL (H)).   Medical History: Past Medical History:  Diagnosis Date  . Collapsed lung   . DDD (degenerative disc disease), cervical   . Diabetes mellitus without complication (H[facility])   . Enlarged prostate   . HTN (hypertension)     Assessment: 54 yo M with chest pain. Pharmacy consulted to dose heparin for ACS/STEMI.  No anticoagulants PTA.  Plans noted for cath on Monday -Heparin level= 0.27    Goal  of Therapy:  Heparin level 0.3-0.7 units/ml Monitor platelets by anticoagulation protocol: Yes   Plan:   -Increase heparin to 1300 units/hr -Heparin level and CBC in am  Wednesday, PharmD Clinical Pharmacist **Pharmacist phone directory can now be found on [email or web address] ([email or web address]).  Listed under [facility].

## 2020-01-20 NOTE — ED Notes (Signed)
Pt in bed on phone. Pt aware he is being transported to [facility]. Denies any pain at this time. Respirations even and unlabored. Will continue to monitor.

## 2020-01-20 NOTE — ED Triage Notes (Addendum)
Pt c/o indigestion that has lingered after eating beef stew last Wednesday. Pt states now he is having pain that radiates down both arms and up neck since Friday. Also endorses shob.

## 2020-01-20 NOTE — ED Notes (Signed)
[facility] called. 

## 2020-01-20 NOTE — ED Notes (Signed)
Date and time results received: 01/20/20 9:46 AM  (use smartphrase ".now" to insert current time)  Test: Trop Critical Value: 774  Name of Provider Notified: Alvino Chapel, RN  Orders Received? Or Actions Taken?: Actions Taken: Surveyor, mining

## 2020-01-20 NOTE — ED Notes (Signed)
Completed pulse check pt pulse 64

## 2020-01-20 NOTE — H&P (Signed)
CARDIOLOGY ADMIT NOTE     Primary Care Physician: Pearson Grippe, MD  Admit Date: 01/20/2020  CC: CP  Guy [name] is a 54 y.o. male with a h/o untreated DM and HTN who is admitted with chest pain worrisome for ACS. He reports that this past Wednesday he began having chest pain.  He initially attributed this to beef stew and white bread.  His symptoms continued to worsen.  He now finds that he has chest discomfort with L arm pain and jaw pain with activities such as hooking up his trailer to his car.  Pain is much worse with activity and improves with rest.  He has SOB with episodes as well. He has had leg pain with ambulation for years.  He smoked 1PPD for 20 years but quit 20 years ago.    Today, he denies symptoms of palpitations, chest pain,  lower extremity edema, dizziness, presyncope, syncope, or neurologic sequela. The patient is tolerating medications without difficulties and is otherwise without complaint today.   Past Medical History:  Diagnosis Date  . Collapsed lung   . DDD (degenerative disc disease), cervical   . Diabetes mellitus without complication ([facility])   . Enlarged prostate   . HTN (hypertension)    Past Surgical History:  Procedure Laterality Date  . APPENDECTOMY    . HERNIA REPAIR       . heparin 1,200 Units/hr (01/20/20 4782)  . nitroGLYCERIN 5 mcg/min (01/20/20 9562)    Allergies  Allergen Reactions  . Penicillins Other (See Comments)    Patient states his joints felt like they were locking up  Has patient had a PCN reaction causing immediate rash, facial/tongue/throat swelling, SOB or lightheadedness with hypotension: no Has patient had a PCN reaction causing severe rash involving mucus membranes or skin necrosis: no Has patient had a PCN reaction that required hospitalization: no Has patient had a PCN reaction occurring within the last 10 years: no If all of the above answers are "NO", then may proceed with Cephalosporin use.   . Sulfa  Antibiotics Other (See Comments)    Patient is unsure of allergy    Social History   Socioeconomic History  . Marital status: Divorced    Spouse name: Not on file  . Number of children: Not on file  . Years of education: Not on file  . Highest education level: Not on file  Occupational History  . Not on file  Tobacco Use  . Smoking status: Former Games developer  . Smokeless tobacco: Never Used  Substance and Sexual Activity  . Alcohol use: No  . Drug use: No  . Sexual activity: Yes  Other Topics Concern  . Not on file  Social History Narrative   Lives in [place].  Works at [employer]:   . Difficulty of Paying Living Expenses: Not on file  Food Insecurity:   . Worried About Programme researcher, broadcasting/film/video in the Last Year: Not on file  . Ran Out of Food in the Last Year: Not on file  Transportation Needs:   . Lack of Transportation (Medical): Not on file  . Lack of Transportation (Non-Medical): Not on file  Physical Activity:   . Days of Exercise per Week: Not on file  . Minutes of Exercise per Session: Not on file  Stress:   . Feeling of Stress : Not on file  Social Connections:   . Frequency of Communication with Friends and  Family: Not on file  . Frequency of Social Gatherings with Friends and Family: Not on file  . Attends Religious Services: Not on file  . Active Member of Clubs or Organizations: Not on file  . Attends Banker Meetings: Not on file  . Marital Status: Not on file  Intimate Partner Violence:   . Fear of Current or Ex-Partner: Not on file  . Emotionally Abused: Not on file  . Physically Abused: Not on file  . Sexually Abused: Not on file    Family History  Problem Relation Age of Onset  . Diabetes Other   . CAD Maternal Grandmother   . CAD Maternal Grandfather     ROS- All systems are reviewed and negative except as per the HPI above  Physical Exam: Telemetry: Vitals:   01/20/20  0915 01/20/20 0930 01/20/20 0945 01/20/20 1000  BP: (!) 144/93 (!) 160/85 (!) 160/92 (!) 161/87  Pulse: 78 76 85 (!) 106  Resp: 18 (!) 21 13 (!) 27  Temp:      TempSrc:      SpO2: 96% 98% 97% 97%  Weight:      Height:        GEN- The patient is well appearing, alert and oriented x 3 today.   Head- normocephalic, atraumatic Eyes-  Sclera clear, conjunctiva pink Ears- hearing intact Oropharynx- clear Neck- supple, no JVP Lymph- no cervical lymphadenopathy Lungs- Clear to ausculation bilaterally, normal work of breathing Heart- Regular rate and rhythm, no murmurs, rubs or gallops, PMI not laterally displaced GI- soft, NT, ND, + BS Extremities- no clubbing, cyanosis, or edema MS- no significant deformity or atrophy Skin- no rash or lesion Psych- euthymic mood, full affect Neuro- strength and sensation are intact  EKG:  Sinus with lateral ST depression  Labs:   Lab Results  Component Value Date   WBC 9.2 01/20/2020   HGB 15.7 01/20/2020   HCT 44.8 01/20/2020   MCV 89.6 01/20/2020   PLT 187 01/20/2020    Recent Labs  Lab 01/20/20 0619  NA 137  K 3.8  CL 99  CO2 30  BUN 15  CREATININE 1.27*  CALCIUM 9.8  GLUCOSE 123*   Lab Results  Component Value Date   TROPONINI <0.30 06/26/2013    Lab Results  Component Value Date   CHOL 171 01/20/2020   Lab Results  Component Value Date   HDL 28 (L) 01/20/2020   Lab Results  Component Value Date   LDLCALC 126 (H) 01/20/2020   Lab Results  Component Value Date   TRIG 87 01/20/2020   Lab Results  Component Value Date   CHOLHDL 6.1 01/20/2020   No results found for: LDLDIRECT    Radiology: CXR personally reviewed- no ASDz   ASSESSMENT AND PLAN:   1. Acute coronary syndrome The patient has symptoms that are very concerning at this time for ACS.   I would advise admission to cardiology with plans for cath on Monday. I would recommend left heart catheterization with possible PCI.  Discussed the cath with the  patient. The patient understands that risks included but are not limited to stroke (1 in 1000), death (1 in 1000), kidney failure [usually temporary] (1 in 500), bleeding (1 in 200), allergic reaction [possibly serious] (1 in 200). The patient understands and agrees to proceed.  We will place on heparin drip and IV NTG.  Cycle CMs and follow closeliy Echo.  2. HTN Start on beta blocker and ACE inhibitor  3. Diabetes Accuchecks and SSI hemaglobin A1C  4. HL Check fasting lipids Start high dose statin     Hillis Range, MD 01/20/2020  10:21 AM

## 2020-01-20 NOTE — Progress Notes (Signed)
ANTICOAGULATION CONSULT NOTE - Initial Consult  Pharmacy Consult for heparin Indication: ACS/STEMI  Allergies  Allergen Reactions  . Penicillins Other (See Comments)    Patient states his joints felt like they were locking up  Has patient had a PCN reaction causing immediate rash, facial/tongue/throat swelling, SOB or lightheadedness with hypotension: no Has patient had a PCN reaction causing severe rash involving mucus membranes or skin necrosis: no Has patient had a PCN reaction that required hospitalization: no Has patient had a PCN reaction occurring within the last 10 years: no If all of the above answers are "NO", then may proceed with Cephalosporin use.   . Sulfa Antibiotics Other (See Comments)    Patient is unsure of allergy    Patient Measurements: Height: 5\' 10"  (177.8 cm) Weight: 89.8 kg (197 lb 15.6 oz) IBW/kg (Calculated) : 73 Heparin Dosing Weight: 98.9  Vital Signs: Temp: 98.2 F (36.8 C) (11/13 0626) Temp Source: Oral (11/13 0626) BP: 149/93 (11/13 0715) Pulse Rate: 64 (11/13 0748)  Labs: Recent Labs    01/20/20 0619  HGB 15.7  HCT 44.8  PLT 187  CREATININE 1.27*  TROPONINIHS 390*    Estimated Creatinine Clearance: 75 mL/min (A) (by C-G formula based on SCr of 1.27 mg/dL (H)).   Medical History: Past Medical History:  Diagnosis Date  . Collapsed lung   . Diabetes mellitus without complication ([facility])   . Enlarged prostate     Assessment: 54 yo M with chest pain. Pharmacy consulted to dose heparin for ACS/STEMI.  No anticoagulants PTA.   SCr 1.27, CBC WNL.  Troponin 390.   Goal of Therapy:  Heparin level 0.3-0.7 units/ml Monitor platelets by anticoagulation protocol: Yes   Plan:  Give 4000 units bolus x 1 Start heparin infusion at 1200 units/hr Check anti-Xa level in 6 hours and daily while on heparin Continue to monitor H&H and platelets  57, Pharm.D 01/20/2020 8:00 AM

## 2020-01-20 NOTE — Plan of Care (Signed)

## 2020-01-20 NOTE — ED Notes (Signed)
Attempted to call report, staff stated room is not available/ being cleaned. Will try to call report again.

## 2020-01-20 NOTE — ED Provider Notes (Signed)
[facility] DEPT Provider Note   CSN: [id number] Arrival date & time: 01/20/20  9326     History Chief Complaint  Patient presents with   Chest Pain    Guy [name] is a 54 y.o. male with pertinent past medical history of diabetes that presents the emergency department today for indigestion.  Patient states that he ate beef stew Wednesday and started having indigestion symptoms that have persisted untill today.  Patient states that he normally has acid reflux, however has not lingered this long.  Describes his indigestion as chest pressure that is in the center of his chest, has worsened over  today and has now radiating to his bilateral arms and jaws.  Also admits to shortness of breath.  States that chest pain is worse when he walks, had to stop walking today to catch his breath and because his chest pain was very bad.  Denies any cardiac history that he is aware of, has never had an MI.  Denies any numbness or tingling, headache,.  States that he has been trying to take his normal acid reflux medications without any relief.  Has never seen a cardiologist.  HPI     Past Medical History:  Diagnosis Date   Collapsed lung    Diabetes mellitus without complication ([place])    Enlarged prostate     Patient Active Problem List   Diagnosis Date Noted   NSTEMI (non-ST elevated myocardial infarction) ([place]) 01/20/2020   Impingement syndrome of left shoulder 07/20/2018   Tendinitis of left rotator cuff 07/20/2018   Traumatic incomplete tear of left rotator cuff 07/20/2018   SHORTNESS OF BREATH (SOB) 10/15/2008   HYPERLIPIDEMIA 09/24/2008   Pneumothorax 09/24/2008   PNEUMOTHORAX 09/24/2008    Past Surgical History:  Procedure Laterality Date   APPENDECTOMY     HERNIA REPAIR         Family History  Problem Relation Age of Onset   Diabetes Other     Social History   Tobacco Use   Smoking status: Former Smoker   Smokeless  tobacco: Never Used  Substance Use Topics   Alcohol use: No   Drug use: No    Home Medications Prior to Admission medications   Medication Sig Start Date End Date Taking? Authorizing Provider  Aspirin-Acetaminophen-Caffeine (GOODY HEADACHE PO) Take 1 packet by mouth as needed (headache/pain).    [provider]  Blood Glucose Monitoring Suppl (BLOOD GLUCOSE METER) kit Use as instructed 06/26/13   Virgel Manifold, MD  cyclobenzaprine (FLEXERIL) 10 MG tablet Take 1 tablet (10 mg total) by mouth 2 (two) times daily as needed for muscle spasms. 12/21/19   Truddie Hidden, MD  ibuprofen (ADVIL,MOTRIN) 200 MG tablet Take 200-400 mg by mouth every 8 (eight) hours as needed (for pain).    [provider]  Lancets Thin MISC 1 each by Does not apply route 4 (four) times daily -  before meals and at bedtime. 06/26/13   Virgel Manifold, MD  metFORMIN (GLUCOPHAGE) 500 MG tablet Take 1 tablet (500 mg total) by mouth 2 (two) times daily with a meal. Patient not taking: Reported on 12/21/2019 06/26/13 12/21/19  Varney Biles, MD  pantoprazole (PROTONIX) 20 MG tablet Take 1 tablet (20 mg total) by mouth daily. Patient not taking: Reported on 12/21/2019 04/25/15 12/21/19  Orpah Greek, MD    Allergies    Penicillins and Sulfa antibiotics  Review of Systems   Review of Systems  Constitutional: Negative for chills,  diaphoresis, fatigue and fever.  HENT: Negative for congestion, sore throat and trouble swallowing.   Eyes: Negative for pain and visual disturbance.  Respiratory: Positive for shortness of breath. Negative for cough and wheezing.   Cardiovascular: Positive for chest pain. Negative for palpitations and leg swelling.  Gastrointestinal: Negative for abdominal distention, abdominal pain, diarrhea, nausea and vomiting.  Genitourinary: Negative for difficulty urinating.  Musculoskeletal: Negative for back pain, neck pain and neck stiffness.  Skin: Negative for pallor.   Neurological: Negative for dizziness, speech difficulty, weakness and headaches.  Psychiatric/Behavioral: Negative for confusion.    Physical Exam Updated Vital Signs BP 135/81    Pulse 64    Temp 98.2 F (36.8 C) (Oral)    Resp 15    Ht _0  (1.778 m)    Wt 89.8 kg    SpO2 99%    BMI 28.41 kg/m   Physical Exam Constitutional:      General: He is not in acute distress.    Appearance: Normal appearance. He is not ill-appearing, toxic-appearing or diaphoretic.  HENT:     Mouth/Throat:     Mouth: Mucous membranes are moist.     Pharynx: Oropharynx is clear.  Eyes:     General: No scleral icterus.    Extraocular Movements: Extraocular movements intact.     Pupils: Pupils are equal, round, and reactive to light.  Cardiovascular:     Rate and Rhythm: Normal rate and regular rhythm.     Pulses: Normal pulses.     Heart sounds: Normal heart sounds.  Pulmonary:     Effort: Pulmonary effort is normal. No respiratory distress.     Breath sounds: Normal breath sounds. No stridor. No wheezing, rhonchi or rales.  Chest:     Chest wall: No tenderness.  Abdominal:     General: Abdomen is flat. There is no distension.     Palpations: Abdomen is soft.     Tenderness: There is no abdominal tenderness. There is no guarding or rebound.  Musculoskeletal:        General: No swelling or tenderness. Normal range of motion.     Cervical back: Normal range of motion and neck supple. No rigidity.     Right lower leg: No edema.     Left lower leg: No edema.  Skin:    General: Skin is warm and dry.     Capillary Refill: Capillary refill takes less than 2 seconds.     Coloration: Skin is not pale.  Neurological:     General: No focal deficit present.     Mental Status: He is alert and oriented to person, place, and time.  Psychiatric:        Mood and Affect: Mood normal.        Behavior: Behavior normal.     ED Results / Procedures / Treatments   Labs (all labs ordered are listed, but  only abnormal results are displayed) Labs Reviewed  BASIC METABOLIC PANEL - Abnormal; Notable for the following components:      Result Value   Glucose, Bld 123 (*)    Creatinine, Ser 1.27 (*)    All other components within normal limits  TROPONIN I (HIGH SENSITIVITY) - Abnormal; Notable for the following components:   Troponin I (High Sensitivity) 390 (*)    All other components within normal limits  RESPIRATORY PANEL BY RT PCR (FLU A&B, COVID)  CBC  HEMOGLOBIN A1C  PROTIME-INR  APTT  LIPID PANEL  TROPONIN I (  HIGH SENSITIVITY)    EKG EKG Interpretation  Date/Time:  Saturday January 20 2020 06:22:48 EST Ventricular Rate:  97 PR Interval:    QRS Duration: 85 QT Interval:  367 QTC Calculation: 467 R Axis:   -13 Text Interpretation: Sinus rhythm Probable left atrial enlargement Repol abnrm suggests ischemia, lateral leads Confirmed by Veryl Speak [phone]) on 01/20/2020 6:26:59 AM   Radiology DG Chest 2 View  Result Date: 01/20/2020 CLINICAL DATA:  Chest pain EXAM: CHEST - 2 VIEW COMPARISON:  06/26/2013 FINDINGS: At 0640 hours. Asymmetric elevation right diaphragm. The lungs are clear without focal pneumonia, edema, pneumothorax or pleural effusion. The cardiopericardial silhouette is within normal limits for size. The visualized bony structures of the thorax show no acute abnormality. IMPRESSION: No active cardiopulmonary disease. Electronically Signed   By: Misty Stanley M.D.   On: 01/20/2020 07:20    Procedures .Critical Care Performed by: Alfredia Client, PA-C Authorized by: Alfredia Client, PA-C   Critical care provider statement:    Critical care time (minutes):  45   Critical care was necessary to treat or prevent imminent or life-threatening deterioration of the following conditions:  Cardiac failure   Critical care was time spent personally by me on the following activities:  Discussions with consultants, evaluation of patient's response to treatment, examination of  patient, ordering and performing treatments and interventions, ordering and review of laboratory studies, ordering and review of radiographic studies, pulse oximetry, re-evaluation of patient's condition, obtaining history from patient or surrogate and review of old charts   (including critical care time)  Medications Ordered in ED Medications  nitroGLYCERIN (NITROSTAT) SL tablet 0.4 mg (0.4 mg Sublingual Given 01/20/20 0641)  nitroGLYCERIN 50 mg in dextrose 5 % 250 mL (0.2 mg/mL) infusion (5 mcg/min Intravenous New Bag/Given 01/20/20 0807)  heparin ADULT infusion 100 units/mL (25000 units/258m sodium chloride 0.45%) (1,200 Units/hr Intravenous New Bag/Given 01/20/20 01856  aspirin chewable tablet 324 mg (324 mg Oral Given 01/20/20 0641)  heparin bolus via infusion 4,000 Units (4,000 Units Intravenous Bolus from Bag 01/20/20 0817)    ED Course  I have reviewed the triage vital signs and the nursing notes.  Pertinent labs & imaging results that were available during my care of the patient were reviewed by me and considered in my medical decision making (see chart for details).    MDM Rules/Calculators/A&P                          Guy PIACENTINIis a 54y.o. male with pertinent past medical history of diabetes that presents the emergency department today for indigestion.  Patient story concerning for ACS in regards to patient's chest pressure which is worse upon exertion and is now radiating into his bilateral arms and neck.  ST depressions noted in lateral leads of EKG, Dr. DStark Jockaware.  They were also ST depressions in his prior EKG 5 years ago, however they do appear to be worse.  While waiting for first troponin will give nitro and aspirin and reevaluate.  First troponin came back at 390, do think that patient is having an NSTEMI.  Heparin and nitro drip started.  Cons[facility] Dr.Allred who agrees with plan[facility]  Upon reevaluation patient states that chest pain has improved  with nitro.  Appears stable.  The patient appears reasonably stabilized for admission considering the current resources, flow, and capabilities available in the ED at this time, and I [facility]  requiring further screening and/or treatment in the ED prior to admission.  I discussed this case with my attending physician who cosigned this note including patient's presenting symptoms, physical exam, and planned diagnostics and interventions. Attending physician stated agreement with plan or made changes to plan which were implemented.     Final Clinical Impression(s) / ED Diagnoses Final diagnoses:  NSTEMI (non-ST elevated myocardial infarction) [facility])    Rx / [facility] Orders ED Discharge Orders    None       Alfredia Client, PA-C 01/20/20 1107    Daleen Bo, MD 01/21/20 1505

## 2020-01-20 NOTE — ED Notes (Signed)
Date and time results received: 01/20/20 7:55 AM  (use smartphrase ".now" to insert current time)  Test: Trop Critical Value: 390  Name of Provider Notified: Allena Katz, [place]  Orders Received? Or Actions Taken?: Actions Taken: Elesa Hacker, [place] and Primary RN Alvino Chapel

## 2020-01-20 NOTE — ED Notes (Signed)
Care link here for to pick up pt

## 2020-01-20 NOTE — ED Notes (Signed)
Report given to RN Orlie Pollen at [facility]

## 2020-01-21 ENCOUNTER — Inpatient Hospital Stay (HOSPITAL_COMMUNITY): Payer: 59

## 2020-01-21 DIAGNOSIS — E118 Type 2 diabetes mellitus with unspecified complications: Secondary | ICD-10-CM | POA: Diagnosis not present

## 2020-01-21 DIAGNOSIS — I214 Non-ST elevation (NSTEMI) myocardial infarction: Secondary | ICD-10-CM | POA: Diagnosis not present

## 2020-01-21 DIAGNOSIS — E7800 Pure hypercholesterolemia, unspecified: Secondary | ICD-10-CM | POA: Diagnosis not present

## 2020-01-21 DIAGNOSIS — I10 Essential (primary) hypertension: Secondary | ICD-10-CM | POA: Diagnosis not present

## 2020-01-21 DIAGNOSIS — R079 Chest pain, unspecified: Secondary | ICD-10-CM

## 2020-01-21 LAB — GLUCOSE, CAPILLARY
Glucose-Capillary: 106 mg/dL — ABNORMAL HIGH (ref 70–99)
Glucose-Capillary: 131 mg/dL — ABNORMAL HIGH (ref 70–99)
Glucose-Capillary: 111 mg/dL — ABNORMAL HIGH (ref 70–99)
Glucose-Capillary: 154 mg/dL — ABNORMAL HIGH (ref 70–99)

## 2020-01-21 LAB — ECHOCARDIOGRAM COMPLETE
Area-P 1/2: 3.99 cm2
Height: 70 in
S' Lateral: 3.8 cm
Weight: 3128.77 oz

## 2020-01-21 LAB — LIPID PANEL
Cholesterol: 144 mg/dL (ref 0–200)
HDL: 28 mg/dL — ABNORMAL LOW (ref >40)
LDL Cholesterol: 93 mg/dL (ref 0–99)
Total CHOL/HDL Ratio: 5.1 RATIO
Triglycerides: 115 mg/dL (ref <150)
VLDL: 23 mg/dL (ref 0–40)

## 2020-01-21 LAB — CBC
HCT: 43.2 % (ref 39.0–52.0)
Hemoglobin: 14.9 g/dL (ref 13.0–17.0)
MCH: 30.5 pg (ref 26.0–34.0)
MCHC: 34.5 g/dL (ref 30.0–36.0)
MCV: 88.5 fL (ref 80.0–100.0)
Platelets: 196 10*3/uL (ref 150–400)
RBC: 4.88 MIL/uL (ref 4.22–5.81)
RDW: 12.3 % (ref 11.5–15.5)
WBC: 11.5 10*3/uL — ABNORMAL HIGH (ref 4.0–10.5)
nRBC: 0 % (ref 0.0–0.2)

## 2020-01-21 LAB — HEPARIN LEVEL (UNFRACTIONATED)
Heparin Unfractionated: 0.41 IU/mL (ref 0.30–0.70)
Heparin Unfractionated: 0.39 IU/mL (ref 0.30–0.70)

## 2020-01-21 MED ORDER — METOPROLOL TARTRATE 25 MG PO TABS: 25.0000 mg | ORAL_TABLET | Freq: Three times a day (TID) | ORAL | Status: DC

## 2020-01-21 MED ORDER — SODIUM CHLORIDE 0.9 % WEIGHT BASED INFUSION
1.0000 mL/kg/h | INTRAVENOUS | Status: DC
  Administered 2020-01-21 – 2020-01-22 (×3): 1 mL/kg/h via INTRAVENOUS

## 2020-01-21 MED ORDER — METOPROLOL TARTRATE 25 MG PO TABS
25.0000 mg | ORAL_TABLET | Freq: Two times a day (BID) | ORAL | Status: DC
  Administered 2020-01-21 – 2020-01-23 (×5): 25 mg via ORAL
  Filled 2020-01-21 (×5): qty 1

## 2020-01-21 MED ORDER — AMLODIPINE BESYLATE 2.5 MG PO TABS
2.5000 mg | ORAL_TABLET | Freq: Every day | ORAL | Status: DC
  Administered 2020-01-21 – 2020-01-23 (×3): 2.5 mg via ORAL
  Filled 2020-01-21 (×3): qty 1

## 2020-01-21 NOTE — Progress Notes (Addendum)
Progress Note  Patient Name: Guy [name] Date of Encounter: 01/21/2020  [facility] Cardiologist: Hillis Range, MD  Subjective   No further chest pain.  hsTrop 390>774.    Inpatient Medications    Scheduled Meds: . aspirin EC  81 mg Oral Daily  . atorvastatin  80 mg Oral Daily  . insulin aspart  0-15 Units Subcutaneous TID WC  . insulin aspart  0-5 Units Subcutaneous QHS  . lisinopril  5 mg Oral Daily  . metoprolol tartrate  25 mg Oral BID  . sodium chloride flush  3 mL Intravenous Q12H  . sodium chloride flush  3 mL Intravenous Q12H   Continuous Infusions: . sodium chloride    . sodium chloride    . sodium chloride    . heparin 1,300 Units/hr (01/20/20 2224)  . nitroGLYCERIN Stopped (01/21/20 0010)   PRN Meds: sodium chloride, sodium chloride, acetaminophen, nitroGLYCERIN, ondansetron (ZOFRAN) IV, sodium chloride flush, sodium chloride flush   Vital Signs    Vitals:   01/20/20 2218 01/21/20 0452 01/21/20 0453 01/21/20 0859  BP: 128/79 (!) 146/91  (!) 153/82  Pulse:   65 67  Resp:   18 18  Temp: 98.2 F (36.8 C)  98 F (36.7 C) 98.2 F (36.8 C)  TempSrc: Oral  Oral Oral  SpO2: 99%  98%   Weight:   88.7 kg   Height:        Intake/Output Summary (Last 24 hours) at 01/21/2020 0922 Last data filed at 01/21/2020 0762 Gross per 24 hour  Intake 466.99 ml  Output 1750 ml  Net -1283.01 ml   Last 3 Weights 01/21/2020 01/20/2020 01/20/2020  Weight (lbs) 195 lb 8.8 oz 199 lb 1.6 oz 197 lb 15.6 oz  Weight (kg) 88.7 kg 90.311 kg 89.8 kg      Telemetry    Sinus bradycardia - Personally Reviewed  ECG    Sinus bradycardia at 58bpm with inferolateral T wave abnormalities - Personally Reviewed  Physical Exam   GEN: No acute distress.   Neck: No JVD Cardiac: RRR, no murmurs, rubs, or gallops.  Respiratory: Clear to auscultation bilaterally. GI: Soft, nontender, non-distended  MS: No edema; No deformity. Neuro:  Nonfocal  Psych: Normal affect    Labs    High Sensitivity Troponin:   Recent Labs  Lab 01/20/20 0619 01/20/20 0819  TROPONINIHS 390* 774*      Chemistry Recent Labs  Lab 01/20/20 0619  NA 137  K 3.8  CL 99  CO2 30  GLUCOSE 123*  BUN 15  CREATININE 1.27*  CALCIUM 9.8  GFRNONAA >60  ANIONGAP 8     Hematology Recent Labs  Lab 01/20/20 0619 01/21/20 0308  WBC 9.2 11.5*  RBC 5.00 4.88  HGB 15.7 14.9  HCT 44.8 43.2  MCV 89.6 88.5  MCH 31.4 30.5  MCHC 35.0 34.5  RDW 12.2 12.3  PLT 187 196    BNPNo results for input(s): BNP, PROBNP in the last 168 hours.   DDimer No results for input(s): DDIMER in the last 168 hours.    Radiology   DG Chest 2 View  Result Date: 01/20/2020 CLINICAL DATA:  Chest pain EXAM: CHEST - 2 VIEW COMPARISON:  06/26/2013 FINDINGS: At 0640 hours. Asymmetric elevation right diaphragm. The lungs are clear without focal pneumonia, edema, pneumothorax or pleural effusion. The cardiopericardial silhouette is within normal limits for size. The visualized bony structures of the thorax show no acute abnormality. IMPRESSION: No active cardiopulmonary disease. Electronically Signed  By: Kennith Center M.D.   On: 01/20/2020 07:20    Cardiac Studies   2D echo pending  Patient Profile     54 y.o. male with a h/o untreated DM and HTN who is admitted with chest pain worrisome for ACS.  Assessment & Plan    1.  NSTEMI -admitted with CP -multiple CRFs including untreated DM, HTN, remote tobacco use -ruled in for MI with hsTrop 390>774. -no further CP -plan for LHC on Monday to define coronary anatomy.  -Cardiac catheterization was discussed with the patient fully. The patient understands that risks include but are not limited to stroke (1 in 1000), death (1 in 1000), kidney failure [usually temporary] (1 in 500), bleeding (1 in 200), allergic reaction [possibly serious] (1 in 200).  The patient understands and is willing to proceed.   -continue ASA, high dose statin, IV Heparin  gtt,  BB -IV NTG off due to HA  2.  HTN -BP elevated at 153/48mmHg -cannot increase BB further due to resting bradycardia -continue Lopressor 25mg  BID -add Amlodipine 2.5mg  daily -hold ACEI due to bump in SCr  3. HLD -LDL goal < 70 -LDL was 93 -now on atorvastatin 80mg  daily -repeat FLp and ALT in 6 weeks  4.  DM2 -untreated -HbA1C 5.7%  5.  AKI -mildly bumped SCr at 1.27 -start IVF precath and hold ACE -BMET in am  I have spent a total of 35 minutes with patient reviewing 2D echo , telemetry, EKGs, labs and examining patient as well as establishing an assessment and plan that was discussed with the patient.  > 50% of time was spent in direct patient care.    For questions or updates, please contact [facility] Please consult [email or web address] for contact info under        Signed, , MD  01/21/2020, 9:22 AM

## 2020-01-21 NOTE — Progress Notes (Signed)
  Echocardiogram 2D Echocardiogram has been performed.  Guy [name] 01/21/2020, 9:59 AM

## 2020-01-21 NOTE — Progress Notes (Addendum)
ANTICOAGULATION CONSULT NOTE - Follow Up Consult  Pharmacy Consult for heparin Indication: chest pain/ACS  Allergies  Allergen Reactions  . Penicillins Other (See Comments)    Patient states his joints felt like they were locking up  Has patient had a PCN reaction causing immediate rash, facial/tongue/throat swelling, SOB or lightheadedness with hypotension: no Has patient had a PCN reaction causing severe rash involving mucus membranes or skin necrosis: no Has patient had a PCN reaction that required hospitalization: no Has patient had a PCN reaction occurring within the last 10 years: no If all of the above answers are "NO", then may proceed with Cephalosporin use.   . Sulfa Antibiotics Other (See Comments)    Patient is unsure of allergy    Patient Measurements: Height: 5' 10"  (177.8 cm) Weight: 88.7 kg (195 lb 8.8 oz) IBW/kg (Calculated) : 73 Heparin Dosing Weight: 98.9 kg  Vital Signs: Temp: 98 F (36.7 C) (11/14 0453) Temp Source: Oral (11/14 0453) BP: 146/91 (11/14 0452) Pulse Rate: 65 (11/14 0453)  Labs: Recent Labs    01/20/20 0619 01/20/20 0754 01/20/20 0819 01/20/20 0900 01/20/20 1824 01/21/20 0308  HGB 15.7  --   --   --   --  14.9  HCT 44.8  --   --   --   --  43.2  PLT 187  --   --   --   --  196  APTT  --  >200*  --   --   --   --   LABPROT  --  14.5  --   --   --   --   INR  --  1.2  --   --   --   --   HEPARINUNFRC  --   --   --  0.76* 0.27* 0.41  CREATININE 1.27*  --   --   --   --   --   TROPONINIHS 390*  --  774*  --   --   --     Estimated Creatinine Clearance: 74.6 mL/min (A) (by C-G formula based on SCr of 1.27 mg/dL (H)).   Medications:  Medications Prior to Admission  Medication Sig Dispense Refill Last Dose  . Aspirin-Acetaminophen-Caffeine (GOODY HEADACHE PO) Take 1 packet by mouth as needed (headache/pain).   unknown  . Blood Glucose Monitoring Suppl (BLOOD GLUCOSE METER) kit Use as instructed 1 each 0   . ibuprofen  (ADVIL,MOTRIN) 200 MG tablet Take 200-400 mg by mouth every 8 (eight) hours as needed (for pain).   unknown  . Lancets Thin MISC 1 each by Does not apply route 4 (four) times daily -  before meals and at bedtime. 200 each 3   . cyclobenzaprine (FLEXERIL) 10 MG tablet Take 1 tablet (10 mg total) by mouth 2 (two) times daily as needed for muscle spasms. (Patient not taking: Reported on 01/20/2020) 20 tablet 0 Not Taking at Unknown time   Scheduled:  . aspirin EC  81 mg Oral Daily  . atorvastatin  80 mg Oral Daily  . insulin aspart  0-15 Units Subcutaneous TID WC  . insulin aspart  0-5 Units Subcutaneous QHS  . lisinopril  5 mg Oral Daily  . metoprolol tartrate  25 mg Oral BID  . sodium chloride flush  3 mL Intravenous Q12H  . sodium chloride flush  3 mL Intravenous Q12H   Infusions:  . sodium chloride    . sodium chloride    . sodium chloride    .  heparin 1,300 Units/hr (01/20/20 2224)  . nitroGLYCERIN Stopped (01/21/20 0010)   PRN: sodium chloride, sodium chloride, acetaminophen, nitroGLYCERIN, nitroGLYCERIN, ondansetron (ZOFRAN) IV, sodium chloride flush, sodium chloride flush   Anti-infectives (From admission, onward)   None      Assessment: 54 yo male presents with chest pain. PTA the patient is not on any anticoagulation. The patient is planned to get a cath with possible PCI on 11/15. Pharmacy is consulted to dose heparin.  Heparin level this morning is therapeutic at 0.41 while running at 1300 units/hr. CBC is stable. Per the RN, there were no issues with the infusion and there are no signs or symptoms of bleeding.   Goal of Therapy:  Heparin level 0.3-0.7 units/ml Monitor platelets by anticoagulation protocol: Yes   Plan:  Continue heparin IV at 1300 units/hr Obtain 6 hour heparin level to confirm Monitor signs and symptoms of bleeding Monitor daily heparin level and CBC  Addendum: 0900 heparin level remained therapeutic at 0.39. Will continue above plan of heparin IV  at 1300 units/hr and check the next heparin level with AM labs since the patient has had two therapeutic levels in a row.  Shauna Hugh, PharmD, [place]  PGY-1 Pharmacy Resident 01/21/2020 7:46 AM  Please check [email or web address] for unit-specific pharmacy phone numbers.

## 2020-01-22 ENCOUNTER — Encounter (HOSPITAL_COMMUNITY): Payer: Self-pay | Admitting: Cardiovascular Disease

## 2020-01-22 ENCOUNTER — Encounter (HOSPITAL_COMMUNITY)
Admission: EM | Disposition: A | Discharge status: Home / Self Care | Payer: Self-pay | Source: Home / Self Care | Attending: Internal Medicine

## 2020-01-22 DIAGNOSIS — E785 Hyperlipidemia, unspecified: Secondary | ICD-10-CM

## 2020-01-22 DIAGNOSIS — I2510 Atherosclerotic heart disease of native coronary artery without angina pectoris: Secondary | ICD-10-CM

## 2020-01-22 DIAGNOSIS — I10 Essential (primary) hypertension: Secondary | ICD-10-CM | POA: Diagnosis not present

## 2020-01-22 DIAGNOSIS — N179 Acute kidney failure, unspecified: Secondary | ICD-10-CM | POA: Diagnosis not present

## 2020-01-22 DIAGNOSIS — I214 Non-ST elevation (NSTEMI) myocardial infarction: Secondary | ICD-10-CM | POA: Diagnosis not present

## 2020-01-22 HISTORY — PX: CORONARY STENT INTERVENTION: CATH118234

## 2020-01-22 HISTORY — PX: LEFT HEART CATH AND CORONARY ANGIOGRAPHY: CATH118249

## 2020-01-22 LAB — BASIC METABOLIC PANEL
Anion gap: 8 (ref 5–15)
BUN: 17 mg/dL (ref 6–20)
CO2: 24 mmol/L (ref 22–32)
Calcium: 8.8 mg/dL — ABNORMAL LOW (ref 8.9–10.3)
Chloride: 102 mmol/L (ref 98–111)
Creatinine, Ser: 1.07 mg/dL (ref 0.61–1.24)
GFR, Estimated: >60 mL/min (ref >60)
Glucose, Bld: 105 mg/dL — ABNORMAL HIGH (ref 70–99)
Potassium: 4 mmol/L (ref 3.5–5.1)
Sodium: 134 mmol/L — ABNORMAL LOW (ref 135–145)

## 2020-01-22 LAB — CBC
HCT: 42.8 % (ref 39.0–52.0)
Hemoglobin: 14.8 g/dL (ref 13.0–17.0)
MCH: 30.6 pg (ref 26.0–34.0)
MCHC: 34.6 g/dL (ref 30.0–36.0)
MCV: 88.4 fL (ref 80.0–100.0)
Platelets: 197 10*3/uL (ref 150–400)
RBC: 4.84 MIL/uL (ref 4.22–5.81)
RDW: 12.3 % (ref 11.5–15.5)
WBC: 10.5 10*3/uL (ref 4.0–10.5)
nRBC: 0 % (ref 0.0–0.2)

## 2020-01-22 LAB — GLUCOSE, CAPILLARY
Glucose-Capillary: 110 mg/dL — ABNORMAL HIGH (ref 70–99)
Glucose-Capillary: 128 mg/dL — ABNORMAL HIGH (ref 70–99)
Glucose-Capillary: 107 mg/dL — ABNORMAL HIGH (ref 70–99)

## 2020-01-22 LAB — POCT ACTIVATED CLOTTING TIME
Activated Clotting Time: >1000 seconds
Activated Clotting Time: >1000 seconds
Activated Clotting Time: >1000 seconds

## 2020-01-22 LAB — HEPARIN LEVEL (UNFRACTIONATED): Heparin Unfractionated: 0.37 IU/mL (ref 0.30–0.70)

## 2020-01-22 LAB — TROPONIN I (HIGH SENSITIVITY): Troponin I (High Sensitivity): 546 ng/L (ref <18)

## 2020-01-22 SURGERY — LEFT HEART CATH AND CORONARY ANGIOGRAPHY
Anesthesia: LOCAL

## 2020-01-22 MED ORDER — HEPARIN (PORCINE) IN NACL 1000-0.9 UT/500ML-% IV SOLN
INTRAVENOUS | Status: AC
  Filled 2020-01-22: qty 1000

## 2020-01-22 MED ORDER — HEART ATTACK BOUNCING BOOK
Freq: Once | Status: AC
  Filled 2020-01-22: qty 1

## 2020-01-22 MED ORDER — LIDOCAINE HCL (PF) 1 % IJ SOLN
INTRAMUSCULAR | Status: DC | PRN
  Administered 2020-01-22: 2 mL

## 2020-01-22 MED ORDER — DIAZEPAM 5 MG PO TABS: 5.0000 mg | ORAL_TABLET | Freq: Four times a day (QID) | ORAL | Status: DC | PRN

## 2020-01-22 MED ORDER — SODIUM CHLORIDE 0.9% FLUSH
3.0000 mL | Freq: Two times a day (BID) | INTRAVENOUS | Status: DC
  Administered 2020-01-23: 3 mL via INTRAVENOUS

## 2020-01-22 MED ORDER — MELATONIN 3 MG PO TABS
3.0000 mg | ORAL_TABLET | Freq: Every evening | ORAL | Status: DC | PRN
  Administered 2020-01-22: 3 mg via ORAL
  Filled 2020-01-22: qty 1

## 2020-01-22 MED ORDER — SODIUM CHLORIDE 0.9 % IV SOLN: INTRAVENOUS | Status: DC

## 2020-01-22 MED ORDER — MIDAZOLAM HCL 2 MG/2ML IJ SOLN
INTRAMUSCULAR | Status: DC | PRN
  Administered 2020-01-22 (×2): 1 mg via INTRAVENOUS
  Administered 2020-01-22: 2 mg via INTRAVENOUS

## 2020-01-22 MED ORDER — THE SENSUOUS HEART BOOK
Freq: Once | Status: AC
  Filled 2020-01-22: qty 1

## 2020-01-22 MED ORDER — ATROPINE SULFATE 1 MG/10ML IJ SOSY
PREFILLED_SYRINGE | INTRAMUSCULAR | Status: DC | PRN
  Administered 2020-01-22 (×2): 0.5 mg via INTRAVENOUS

## 2020-01-22 MED ORDER — VERAPAMIL HCL 2.5 MG/ML IV SOLN
INTRAVENOUS | Status: AC
  Filled 2020-01-22: qty 2

## 2020-01-22 MED ORDER — ASPIRIN 81 MG PO CHEW: 81.0000 mg | CHEWABLE_TABLET | Freq: Every day | ORAL | Status: DC

## 2020-01-22 MED ORDER — SODIUM CHLORIDE 0.9% FLUSH: 3.0000 mL | INTRAVENOUS | Status: DC | PRN

## 2020-01-22 MED ORDER — HEPARIN SODIUM (PORCINE) 1000 UNIT/ML IJ SOLN
INTRAMUSCULAR | Status: DC | PRN
  Administered 2020-01-22: 6500 [IU] via INTRAVENOUS
  Administered 2020-01-22: 4500 [IU] via INTRAVENOUS

## 2020-01-22 MED ORDER — TICAGRELOR 90 MG PO TABS
90.0000 mg | ORAL_TABLET | Freq: Two times a day (BID) | ORAL | Status: DC
  Administered 2020-01-22 – 2020-01-23 (×2): 90 mg via ORAL
  Filled 2020-01-22 (×2): qty 1

## 2020-01-22 MED ORDER — MIDAZOLAM HCL 2 MG/2ML IJ SOLN
INTRAMUSCULAR | Status: AC
  Filled 2020-01-22: qty 2

## 2020-01-22 MED ORDER — SODIUM CHLORIDE 0.9 % IV SOLN: 250.0000 mL | INTRAVENOUS | Status: DC | PRN

## 2020-01-22 MED ORDER — LIDOCAINE HCL (PF) 1 % IJ SOLN
INTRAMUSCULAR | Status: AC
  Filled 2020-01-22: qty 30

## 2020-01-22 MED ORDER — IOHEXOL 350 MG/ML SOLN
INTRAVENOUS | Status: DC | PRN
  Administered 2020-01-22: 120 mL

## 2020-01-22 MED ORDER — TICAGRELOR 90 MG PO TABS
ORAL_TABLET | ORAL | Status: AC
  Filled 2020-01-22: qty 2

## 2020-01-22 MED ORDER — HEPARIN (PORCINE) IN NACL 1000-0.9 UT/500ML-% IV SOLN
INTRAVENOUS | Status: DC | PRN
  Administered 2020-01-22 (×2): 500 mL

## 2020-01-22 MED ORDER — VERAPAMIL HCL 2.5 MG/ML IV SOLN
INTRAVENOUS | Status: DC | PRN
  Administered 2020-01-22 (×3): 10 mL via INTRA_ARTERIAL

## 2020-01-22 MED ORDER — HEPARIN SODIUM (PORCINE) 1000 UNIT/ML IJ SOLN
INTRAMUSCULAR | Status: AC
  Filled 2020-01-22: qty 1

## 2020-01-22 MED ORDER — HYDRALAZINE HCL 20 MG/ML IJ SOLN: 10.0000 mg | INTRAMUSCULAR | Status: AC | PRN

## 2020-01-22 MED ORDER — TICAGRELOR 90 MG PO TABS
ORAL_TABLET | ORAL | Status: DC | PRN
  Administered 2020-01-22: 180 mg via ORAL

## 2020-01-22 MED ORDER — ATROPINE SULFATE 1 MG/10ML IJ SOSY
PREFILLED_SYRINGE | INTRAMUSCULAR | Status: AC
  Filled 2020-01-22: qty 10

## 2020-01-22 MED ORDER — FENTANYL CITRATE (PF) 100 MCG/2ML IJ SOLN
INTRAMUSCULAR | Status: AC
  Filled 2020-01-22: qty 2

## 2020-01-22 MED ORDER — NITROGLYCERIN 1 MG/10 ML FOR IR/CATH LAB
INTRA_ARTERIAL | Status: DC | PRN
  Administered 2020-01-22 (×4): 200 ug via INTRACORONARY

## 2020-01-22 MED ORDER — FENTANYL CITRATE (PF) 100 MCG/2ML IJ SOLN
INTRAMUSCULAR | Status: DC | PRN
  Administered 2020-01-22 (×2): 25 ug via INTRAVENOUS
  Administered 2020-01-22: 50 ug via INTRAVENOUS

## 2020-01-22 MED ORDER — ANGIOPLASTY BOOK
Freq: Once | Status: AC
  Filled 2020-01-22: qty 1

## 2020-01-22 MED ORDER — LABETALOL HCL 5 MG/ML IV SOLN: 10.0000 mg | INTRAVENOUS | Status: AC | PRN

## 2020-01-22 MED ORDER — ATORVASTATIN CALCIUM 80 MG PO TABS: 80.0000 mg | ORAL_TABLET | Freq: Every day | ORAL | Status: DC

## 2020-01-22 MED ORDER — ONDANSETRON HCL 4 MG/2ML IJ SOLN: 4.0000 mg | Freq: Four times a day (QID) | INTRAMUSCULAR | Status: DC | PRN

## 2020-01-22 MED ORDER — ACETAMINOPHEN 325 MG PO TABS: 650.0000 mg | ORAL_TABLET | ORAL | Status: DC | PRN

## 2020-01-22 MED ORDER — NITROGLYCERIN 1 MG/10 ML FOR IR/CATH LAB
INTRA_ARTERIAL | Status: AC
  Filled 2020-01-22: qty 10

## 2020-01-22 SURGICAL SUPPLY — 19 items
BALLN SAPPHIRE 2.0X12 (BALLOONS) ×2
BALLN SAPPHIRE ~~LOC~~ 3.75X18 (BALLOONS) ×1 IMPLANT
BALLOON SAPPHIRE 2.0X12 (BALLOONS) IMPLANT
CATH INFINITI 5 FR JL3.5 (CATHETERS) ×1 IMPLANT
CATH INFINITI 5FR JL4 (CATHETERS) ×1 IMPLANT
CATH LAUNCHER 6FR EBU3.5 (CATHETERS) ×1 IMPLANT
CATH LAUNCHER 6FR JR4 (CATHETERS) ×1 IMPLANT
CATH OPTITORQUE TIG 4.0 5F (CATHETERS) ×1 IMPLANT
DEVICE RAD COMP TR BAND LRG (VASCULAR PRODUCTS) ×1 IMPLANT
GLIDESHEATH SLEND SS 6F .021 (SHEATH) ×1 IMPLANT
GUIDEWIRE INQWIRE 1.5J.035X260 (WIRE) IMPLANT
INQWIRE 1.5J .035X260CM (WIRE) ×4
KIT ENCORE 26 ADVANTAGE (KITS) ×1 IMPLANT
KIT HEART LEFT (KITS) ×2 IMPLANT
PACK CARDIAC CATHETERIZATION (CUSTOM PROCEDURE TRAY) ×2 IMPLANT
STENT RESOLUTE ONYX 3.5X34 (Permanent Stent) ×1 IMPLANT
TRANSDUCER W/STOPCOCK (MISCELLANEOUS) ×2 IMPLANT
TUBING CIL FLEX 10 FLL-RA (TUBING) ×2 IMPLANT
WIRE COUGAR XT STRL 190CM (WIRE) ×1 IMPLANT

## 2020-01-22 NOTE — Progress Notes (Addendum)
Progress Note  Patient Name: Guy [name] Date of Encounter: 01/22/2020  [facility] Cardiologist: New  Subjective   LHC today. Reported twinges of chest pain overnight but nothing sever.   Inpatient Medications    Scheduled Meds: . amLODipine  2.5 mg Oral Daily  . aspirin EC  81 mg Oral Daily  . atorvastatin  80 mg Oral Daily  . insulin aspart  0-15 Units Subcutaneous TID WC  . insulin aspart  0-5 Units Subcutaneous QHS  . metoprolol tartrate  25 mg Oral BID  . sodium chloride flush  3 mL Intravenous Q12H  . sodium chloride flush  3 mL Intravenous Q12H   Continuous Infusions: . sodium chloride    . sodium chloride    . sodium chloride 1 mL/kg/hr (01/22/20 5364)  . heparin 1,300 Units/hr (01/21/20 1914)  . nitroGLYCERIN Stopped (01/21/20 0010)   PRN Meds: sodium chloride, sodium chloride, acetaminophen, melatonin, nitroGLYCERIN, ondansetron (ZOFRAN) IV, sodium chloride flush, sodium chloride flush   Vital Signs    Vitals:   01/21/20 1013 01/21/20 1300 01/21/20 2017 01/21/20 2348  BP: (!) 158/84 (!) 142/68 132/78 128/86  Pulse: 67 67 65 70  Resp:  18 18 18   Temp:  98.8 F (37.1 C) 98 F (36.7 C) 98 F (36.7 C)  TempSrc:  Oral Oral Oral  SpO2:  99% 99% 98%  Weight:      Height:        Intake/Output Summary (Last 24 hours) at 01/22/2020 0641 Last data filed at 01/22/2020 0434 Gross per 24 hour  Intake 1334.14 ml  Output 2725 ml  Net -1390.86 ml   Last 3 Weights 01/21/2020 01/20/2020 01/20/2020  Weight (lbs) 195 lb 8.8 oz 199 lb 1.6 oz 197 lb 15.6 oz  Weight (kg) 88.7 kg 90.311 kg 89.8 kg      Telemetry    NSR, HR 70s, PVCs - Personally Reviewed  ECG     No new- Personally Reviewed  Physical Exam   GEN: No acute distress.   Neck: No JVD Cardiac: RRR, no murmurs, rubs, or gallops.  Respiratory: Clear to auscultation bilaterally. GI: Soft, nontender, non-distended  MS: No edema; No deformity. Neuro:  Nonfocal  Psych: Normal affect    Labs    High Sensitivity Troponin:   Recent Labs  Lab 01/20/20 0619 01/20/20 0819  TROPONINIHS 390* 774*      Chemistry Recent Labs  Lab 01/20/20 0619 01/22/20 0442  NA 137 134*  K 3.8 4.0  CL 99 102  CO2 30 24  GLUCOSE 123* 105*  BUN 15 17  CREATININE 1.27* 1.07  CALCIUM 9.8 8.8*  GFRNONAA >60 >60  ANIONGAP 8 8     Hematology Recent Labs  Lab 01/20/20 0619 01/21/20 0308 01/22/20 0442  WBC 9.2 11.5* 10.5  RBC 5.00 4.88 4.84  HGB 15.7 14.9 14.8  HCT 44.8 43.2 42.8  MCV 89.6 88.5 88.4  MCH 31.4 30.5 30.6  MCHC 35.0 34.5 34.6  RDW 12.2 12.3 12.3  PLT 187 196 197    BNPNo results for input(s): BNP, PROBNP in the last 168 hours.   DDimer No results for input(s): DDIMER in the last 168 hours.   Radiology    ECHOCARDIOGRAM COMPLETE  Result Date: 01/21/2020    ECHOCARDIOGRAM REPORT   Patient Name:   Guy [name] Date of Exam: 01/21/2020 Medical Rec #:  01/23/2020          Height:       70.0 in  Accession #:    [id number]         Weight:       195.5 lb Date of Birth:  08/06/1965          BSA:          2.067 m Patient Age:    54 years           BP:           146/91 mmHg Patient Gender: M                  HR:           60 bpm. Exam Location:  Inpatient Procedure: 2D Echo, Cardiac Doppler and Color Doppler Indications:    R07.9* Chest pain, unspecified  History:        Patient has no prior history of Echocardiogram examinations.                 Signs/Symptoms:Chest Pain; Risk Factors:Hypertension and                 Diabetes.  Sonographer:    Eulah Pont RDCS Referring Phys: [id number] JAMES ALLRED IMPRESSIONS  1. Left ventricular ejection fraction, by estimation, is 60 to 65%. The left ventricle has normal function. The left ventricle has no regional wall motion abnormalities. Left ventricular diastolic parameters are consistent with Grade I diastolic dysfunction (impaired relaxation).  2. Right ventricular systolic function is normal. The right ventricular size is  normal.  3. The mitral valve is normal in structure. Trivial mitral valve regurgitation. No evidence of mitral stenosis.  4. The aortic valve is tricuspid. Aortic valve regurgitation is not visualized. No aortic stenosis is present.  5. The inferior vena cava is normal in size with greater than 50% respiratory variability, suggesting right atrial pressure of 3 mmHg. FINDINGS  Left Ventricle: Left ventricular ejection fraction, by estimation, is 60 to 65%. The left ventricle has normal function. The left ventricle has no regional wall motion abnormalities. The left ventricular internal cavity size was normal in size. There is  no left ventricular hypertrophy. Left ventricular diastolic parameters are consistent with Grade I diastolic dysfunction (impaired relaxation). Right Ventricle: The right ventricular size is normal. Right ventricular systolic function is normal. Left Atrium: Left atrial size was normal in size. Right Atrium: Right atrial size was normal in size. Pericardium: There is no evidence of pericardial effusion. Mitral Valve: The mitral valve is normal in structure. Mild mitral annular calcification. Trivial mitral valve regurgitation. No evidence of mitral valve stenosis. Tricuspid Valve: The tricuspid valve is normal in structure. Tricuspid valve regurgitation is trivial. No evidence of tricuspid stenosis. Aortic Valve: The aortic valve is tricuspid. Aortic valve regurgitation is not visualized. No aortic stenosis is present. Pulmonic Valve: The pulmonic valve was normal in structure. Pulmonic valve regurgitation is not visualized. No evidence of pulmonic stenosis. Aorta: The aortic root is normal in size and structure. Venous: The inferior vena cava is normal in size with greater than 50% respiratory variability, suggesting right atrial pressure of 3 mmHg.  LEFT VENTRICLE PLAX 2D LVIDd:         5.40 cm  Diastology LVIDs:         3.80 cm  LV e' medial:    6.30 cm/s LV PW:         1.00 cm  LV E/e'  medial:  10.8 LV IVS:        1.00 cm  LV e' lateral:  8.18 cm/s LVOT diam:     1.70 cm  LV E/e' lateral: 8.3 LV SV:         54 LV SV Index:   26 LVOT Area:     2.27 cm  RIGHT VENTRICLE RV S prime:     13.00 cm/s TAPSE (M-mode): 2.0 cm LEFT ATRIUM             Index       RIGHT ATRIUM           Index LA diam:        3.40 cm 1.64 cm/m  RA Area:     10.10 cm LA Vol (A2C):   33.8 ml 16.35 ml/m RA Volume:   19.10 ml  9.24 ml/m LA Vol (A4C):   25.8 ml 12.48 ml/m LA Biplane Vol: 30.5 ml 14.75 ml/m  AORTIC VALVE LVOT Vmax:   122.00 cm/s LVOT Vmean:  84.400 cm/s LVOT VTI:    0.240 m  AORTA Ao Root diam: 3.10 cm Ao Asc diam:  2.90 cm MITRAL VALVE MV Area (PHT): 3.99 cm    SHUNTS MV Decel Time: 190 msec    Systemic VTI:  0.24 m MV E velocity: 68.30 cm/s  Systemic Diam: 1.70 cm MV A velocity: 66.80 cm/s MV E/A ratio:  1.02 Olga MillersBrian Crenshaw MD Electronically signed by Olga MillersBrian Crenshaw MD Signature Date/Time: 01/21/2020/10:38:40 AM    Final     Cardiac Studies   Echo 01/21/20 1. Left ventricular ejection fraction, by estimation, is 60 to 65%. The  left ventricle has normal function. The left ventricle has no regional  wall motion abnormalities. Left ventricular diastolic parameters are  consistent with Grade I diastolic  dysfunction (impaired relaxation).  2. Right ventricular systolic function is normal. The right ventricular  size is normal.  3. The mitral valve is normal in structure. Trivial mitral valve  regurgitation. No evidence of mitral stenosis.  4. The aortic valve is tricuspid. Aortic valve regurgitation is not  visualized. No aortic stenosis is present.  5. The inferior vena cava is normal in size with greater than 50%  respiratory variability, suggesting right atrial pressure of 3 mmHg.   Patient Profile     54 y.o. male with pmh of untreated DM, HTN who was admitted with chest pain worrisome for ACS.   Assessment & Plan    NSTEMI - admitted with chest pain and HS troponin  390>774 - started on IV heparin - twinges of chest pain overnight - IV Nitro d/c'd due to headache - Echo showed LVEF 60-65%, no WMA, G1DD, trivial MR - continue aspirin, bb, statin - LHC today. Further recs per cath - creatinine today 1.07  HTN - Lopressor 25mg  BID - ACE held for AKI - amlodipine added and BP improved this AM, 128/86  HLD - LDL 93, goal <70 - Atorvastatin 80 mg daily  DM2 - SSI - A1C 5.7  AKI - mild bump to 1.27 - IVF and ACE held - BMET today shows creatinine 1.07  For questions or updates, please contact [facility] Please consult [email or web address] for contact info under        Signed, Cadence David StallH Furth, PA-C  01/22/2020, 6:41 AM    Patient seen and examined and agree with Cadence Fransico MichaelFurth, PA-C.  In brief, patient is a 54 year old male with history of HTN and DMII who presented with chest pain found to have NSTEMI now planned for coronary angiography.  Exam: GEN: No acute distress.  Neck: No JVD Cardiac: RRR, no murmurs, rubs, or gallops.  Respiratory: Clear to auscultation bilaterally. GI: Soft, nontender, non-distended  MS: No edema; No deformity. Neuro:  Nonfocal  Psych: Normal affect   Plan: -NPO for cath today -Continue heparin gtt -Continue ASA 81mg  and atorvastatin 80mg  -Continue BB; will add ace/arb post-cath pending BP -Cardiac rehab phase II upon discharge  , MD

## 2020-01-22 NOTE — Interval H&P Note (Signed)
Cath Lab Visit (complete for each Cath Lab visit)  Clinical Evaluation Leading to the Procedure:   ACS: No.  Non-ACS:    Anginal Classification: CCS III  Anti-ischemic medical therapy: Maximal Therapy (2 or more classes of medications)  Non-Invasive Test Results: No non-invasive testing performed  Prior CABG: No previous CABG      History and Physical Interval Note:  01/22/2020 7:45 AM  Elyse Hsu  has presented today for surgery, with the diagnosis of NSTEMI.  The various methods of treatment have been discussed with the patient and family. After consideration of risks, benefits and other options for treatment, the patient has consented to  Procedure(s): LEFT HEART CATH AND CORONARY ANGIOGRAPHY (N/A) as a surgical intervention.  The patient's history has been reviewed, patient examined, no change in status, stable for surgery.  I have reviewed the patient's chart and labs.  Questions were answered to the patient's satisfaction.     Nicki Guadalajara

## 2020-01-23 ENCOUNTER — Telehealth: Payer: Self-pay | Admitting: *Deleted

## 2020-01-23 ENCOUNTER — Other Ambulatory Visit: Payer: Self-pay | Admitting: Medical

## 2020-01-23 DIAGNOSIS — I10 Essential (primary) hypertension: Secondary | ICD-10-CM

## 2020-01-23 DIAGNOSIS — Z87891 Personal history of nicotine dependence: Secondary | ICD-10-CM

## 2020-01-23 DIAGNOSIS — I214 Non-ST elevation (NSTEMI) myocardial infarction: Secondary | ICD-10-CM

## 2020-01-23 DIAGNOSIS — IMO0002 Reserved for concepts with insufficient information to code with codable children: Secondary | ICD-10-CM

## 2020-01-23 DIAGNOSIS — E1165 Type 2 diabetes mellitus with hyperglycemia: Secondary | ICD-10-CM

## 2020-01-23 DIAGNOSIS — N289 Disorder of kidney and ureter, unspecified: Secondary | ICD-10-CM

## 2020-01-23 DIAGNOSIS — I200 Unstable angina: Secondary | ICD-10-CM

## 2020-01-23 LAB — BASIC METABOLIC PANEL
Anion gap: 9 (ref 5–15)
BUN: 16 mg/dL (ref 6–20)
CO2: 22 mmol/L (ref 22–32)
Calcium: 8.7 mg/dL — ABNORMAL LOW (ref 8.9–10.3)
Chloride: 106 mmol/L (ref 98–111)
Creatinine, Ser: 1.17 mg/dL (ref 0.61–1.24)
GFR, Estimated: >60 mL/min (ref >60)
Glucose, Bld: 112 mg/dL — ABNORMAL HIGH (ref 70–99)
Potassium: 3.8 mmol/L (ref 3.5–5.1)
Sodium: 137 mmol/L (ref 135–145)

## 2020-01-23 LAB — CBC
HCT: 41.3 % (ref 39.0–52.0)
Hemoglobin: 14.4 g/dL (ref 13.0–17.0)
MCH: 30.9 pg (ref 26.0–34.0)
MCHC: 34.9 g/dL (ref 30.0–36.0)
MCV: 88.6 fL (ref 80.0–100.0)
Platelets: 202 10*3/uL (ref 150–400)
RBC: 4.66 MIL/uL (ref 4.22–5.81)
RDW: 12.5 % (ref 11.5–15.5)
WBC: 13 10*3/uL — ABNORMAL HIGH (ref 4.0–10.5)
nRBC: 0 % (ref 0.0–0.2)

## 2020-01-23 LAB — GLUCOSE, CAPILLARY: Glucose-Capillary: 117 mg/dL — ABNORMAL HIGH (ref 70–99)

## 2020-01-23 MED ORDER — NITROGLYCERIN 0.4 MG SL SUBL: 0.4000 mg | SUBLINGUAL_TABLET | SUBLINGUAL | 1 refills | Status: DC | PRN

## 2020-01-23 MED ORDER — LOSARTAN POTASSIUM 25 MG PO TABS
25.0000 mg | ORAL_TABLET | Freq: Every day | ORAL | Status: DC
  Administered 2020-01-23: 25 mg via ORAL
  Filled 2020-01-23: qty 1

## 2020-01-23 MED ORDER — ATORVASTATIN CALCIUM 80 MG PO TABS: 80.0000 mg | ORAL_TABLET | Freq: Every day | ORAL | 4 refills | Status: DC

## 2020-01-23 MED ORDER — TICAGRELOR 90 MG PO TABS: 90.0000 mg | ORAL_TABLET | Freq: Two times a day (BID) | ORAL | 3 refills | Status: DC

## 2020-01-23 MED ORDER — ASPIRIN 81 MG PO TBEC: 81.0000 mg | DELAYED_RELEASE_TABLET | Freq: Every day | ORAL | 3 refills | Status: DC

## 2020-01-23 MED ORDER — METOPROLOL SUCCINATE ER 25 MG PO TB24: 25.0000 mg | ORAL_TABLET | Freq: Every day | ORAL | Status: DC

## 2020-01-23 MED ORDER — AMLODIPINE BESYLATE 2.5 MG PO TABS: 2.5000 mg | ORAL_TABLET | Freq: Every day | ORAL | 4 refills | Status: DC

## 2020-01-23 MED ORDER — LOSARTAN POTASSIUM 25 MG PO TABS: 25.0000 mg | ORAL_TABLET | Freq: Every day | ORAL | 4 refills | Status: DC

## 2020-01-23 MED ORDER — METOPROLOL SUCCINATE ER 25 MG PO TB24: 25.0000 mg | ORAL_TABLET | Freq: Every day | ORAL | 4 refills | Status: DC

## 2020-01-23 MED FILL — ATORVASTATIN CALCIUM 80 MG: 80 | 30 days supply | Qty: 30 | Fill #0

## 2020-01-23 MED FILL — BRILINTA 90 MG TABLET: 90 | 30 days supply | Qty: 60 | Fill #0

## 2020-01-23 MED FILL — NITROGLYCERIN 0.4 MG TAB SL: 0.4 | 7 days supply | Qty: 25 | Fill #0

## 2020-01-23 MED FILL — METOPROLOL SUCCINATE ER 25: 25 | 30 days supply | Qty: 30 | Fill #0

## 2020-01-23 MED FILL — LOSARTAN POTASSIUM 25 MG TA: 25 | 30 days supply | Qty: 30 | Fill #0

## 2020-01-23 MED FILL — ASPIRIN LOW DOSE 81 MG TBEC: 81 | 30 days supply | Qty: 30 | Fill #0

## 2020-01-23 MED FILL — AMLODIPINE BESYLATE 2.5 MG: 2.5 | 30 days supply | Qty: 30 | Fill #0

## 2020-01-23 NOTE — TOC Benefit Eligibility Note (Signed)
Transition of Care [facility]) Benefit Eligibility Note    Patient Details  Name: Guy [name] MRN: [id number] Date of Birth: 1965-03-27   Medication/Dose: [name] 90mg . bid 30 day supply  Covered?: Yes  Tier: 3 Drug  Prescription Coverage Preferred Pharmacy: [facility],  Spoke with Person/Company/Phone Number:: [phone] [facility]. [phone]  Co-Pay: $85.92  Prior Approval: No  Deductible: Unmet       [phone] Phone Number: 01/23/2020, 2:43 PM

## 2020-01-23 NOTE — Telephone Encounter (Signed)
-----   Message from Cadence David Stall, PA-C sent at 01/23/2020 10:42 AM EST ----- Regarding: hospital f/y labs Please call patient and arrange BMET in 1 week. Thanks!

## 2020-01-23 NOTE — Discharge Summary (Addendum)
Discharge Summary    Patient ID: Guy [name] MRN: [id number]; DOB: 10-15-1965  Admit date: 01/20/2020 Discharge date: 01/23/2020  Primary Care Provider: Jani Gravel, MD  Primary Cardiologist: Freada Bergeron, MD  Primary Electrophysiologist:  None   Discharge Diagnoses    Principal Problem:   NSTEMI (non-ST elevated myocardial infarction) [facility]) Active Problems:   Hyperlipidemia   ACS (acute coronary syndrome) ([place])   Hypertension   Unstable angina ([place])   Diabetes type 2, uncontrolled ([place])   History of tobacco use   Diagnostic Studies/Procedures    Echo 01/21/20 1. Left ventricular ejection fraction, by estimation, is 60 to 65%. The  left ventricle has normal function. The left ventricle has no regional  wall motion abnormalities. Left ventricular diastolic parameters are  consistent with Grade I diastolic  dysfunction (impaired relaxation).  2. Right ventricular systolic function is normal. The right ventricular  size is normal.  3. The mitral valve is normal in structure. Trivial mitral valve  regurgitation. No evidence of mitral stenosis.  4. The aortic valve is tricuspid. Aortic valve regurgitation is not  visualized. No aortic stenosis is present.  5. The inferior vena cava is normal in size with greater than 50%  respiratory variability, suggesting right atrial pressure of 3 mmHg.   Cardiac cath 01/22/20  Mid LAD to Dist LAD lesion is 50% stenosed.  Mid LAD-1 lesion is 30% stenosed.  Mid LAD-2 lesion is 50% stenosed.  2nd Mrg lesion is 40% stenosed.  Prox Cx to Mid Cx lesion is 20% stenosed.  A stent was successfully placed.  Prox RCA lesion is 99% stenosed.  Post intervention, there is a 0% residual stenosis.  Prox RCA to Mid RCA lesion is 40% stenosed.  Post intervention, there is a 0% residual stenosis.  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  Successful percutaneous coronary intervention with PTCA and DES stenting with a 3.5 x 34 mm Resolute Onyx stent postdilated to 3.80 milliliters with a 99% stenosis moderate narrowings involving below the lesion being reduced to 0%. There is brisk TIMI-3 flow with no evidence for dissection.  Transient AV block when crossing the aortic valve which responded to atropine.  RECOMMENDATION: DAPT for at least 1 year. Aggressive lipid-lowering therapy with target LDL less than 70. Optimal blood pressure control with goal less than 130/80 and ideal less than 120/80.  Coronary Diagrams  Diagnostic Dominance: Right  Intervention    _____________   History of Present Illness     Guy [name] is a 54 y.o. male with pmh of untreated DM2, prior tobacco use, and hypertension who was admitted with chest pain. He reported a few days before he started experiencing chest pain. He though it was indigestion at first. Symptoms continued to worsen to left arm pain and jaw pain with activities. He has shortness of breath as well. Pain improved with rest. He went to the ER for evaluation.   Hospital Course     Consultants: None  In the ER HS troponin resulted 390 with subsequent at 774. He was started on IV heparin and IV Nitro and admitted. Creatinine mildly elevated at 1.27. He was started on Aspirin,  high dose statin, and Lopressor. Pressures were high and amlodipine was added. IV Nitro was discontinued with headache. Left heart catheterization showed multivessel CAD with culprit lesion being the RCA with 99% stenosis. He was treated with DES to RCA. LVEDP noted to be 74mHg. He had transient AV block when crossing the aortic valve which responded to  atropine. Post procedure recommendations for DAPT with Aspirin and Brilinta for at least 1 year. Cath site, right radial, remained stable overnight. Echo showed LVEF 60-65%, no WMA, G1DD, trivial MR. Will plan to send in new prescriptions for Aspirin, Brilinta, Atorvastatin, Toprol XL 271mdaily, Losartan 2572maily, and SL nitro. Patient ambulated with cardiac rehab and ambulated without sob or chest pain. Check BMET in 1 week and FLP/LFTs in 6-8 weeks.  The patient was evaluated by Dr. PemJohney Frame 01/23/20 and felt to be stable for discharge.   Did the patient have an acute coronary syndrome (MI, NSTEMI, STEMI, etc) this admission?:  Yes                               AHA/ACC Clinical Performance & Quality Measures: 1. Aspirin prescribed? - Yes 2. ADP Receptor Inhibitor (Plavix/Clopidogrel, Brilinta/Ticagrelor or Effient/Prasugrel) prescribed (includes medically managed patients)? - Yes 3. Beta Blocker prescribed? - Yes 4. High Intensity Statin (Lipitor 40-92m3m Crestor 20-40mg59mescribed? - Yes 5. EF assessed during THIS hospitalization? - Yes 6. For EF <40%, was ACEI/ARB prescribed? - Not Applicable (EF >/= 40%) 41%For EF <40%, Aldosterone Antagonist (Spironolactone or Eplerenone) prescribed? - Not Applicable (EF >/= 40%) 28%Cardiac Rehab Phase II ordered (including medically managed patients)? - Yes       _____________  Discharge Vitals Blood pressure (!) 143/73, pulse 77, temperature 97.9 F (36.6 C), temperature source Oral, resp. rate 16, height _0  (1.778 m), weight 88.2 kg, SpO2 98 %.  Filed Weights   01/21/20 0453 01/22/20 0639 01/23/20 0527  Weight: 88.7 kg 87.1 kg 88.2 kg    Labs & Radiologic Studies    CBC Recent Labs    01/22/20 0442 01/23/20 0241  WBC 10.5 13.0*  HGB 14.8 14.4  HCT 42.8 41.3  MCV 88.4 88.6  PLT 197 202  786sic Metabolic Panel Recent Labs    01/22/20 0442 01/23/20 0241  NA 134* 137  K 4.0 3.8  CL 102 106  CO2 24 22  GLUCOSE 105*  112*  BUN 17 16  CREATININE 1.07 1.17  CALCIUM 8.8* 8.7*   Liver Function Tests No results for input(s): AST, ALT, ALKPHOS, BILITOT, PROT, ALBUMIN in the last 72 hours. No results for input(s): LIPASE, AMYLASE in the last 72 hours. High Sensitivity Troponin:   Recent Labs  Lab 01/20/20 0619 01/20/20 0819 01/22/20 1217  TROPONINIHS 390* 774* 546*    BNP Invalid input(s): POCBNP D-Dimer No results for input(s): DDIMER in the last 72 hours. Hemoglobin A1C No results for input(s): HGBA1C in the last 72 hours. Fasting Lipid Panel Recent Labs    01/21/20 0308  CHOL 144  HDL 28*  LDLCALC 93  TRIG 115  CHOLHDL 5.1   Thyroid Function Tests No results for input(s): TSH, T4TOTAL, T3FREE, THYROIDAB in the last 72 hours.  Invalid input(s): FREET3 _____________  DG Chest 2 View  Result Date: 01/20/2020 CLINICAL DATA:  Chest pain EXAM: CHEST - 2 VIEW COMPARISON:  06/26/2013 FINDINGS: At 0640 hours. Asymmetric elevation right diaphragm. The lungs are clear without focal  pneumonia, edema, pneumothorax or pleural effusion. The cardiopericardial silhouette is within normal limits for size. The visualized bony structures of the thorax show no acute abnormality. IMPRESSION: No active cardiopulmonary disease. Electronically Signed   By: Misty Stanley M.D.   On: 01/20/2020 07:20   CARDIAC CATHETERIZATION  Result Date: 01/22/2020  Mid LAD to Dist LAD lesion is 50% stenosed.  Mid LAD-1 lesion is 30% stenosed.  Mid LAD-2 lesion is 50% stenosed.  2nd Mrg lesion is 40% stenosed.  Prox Cx to Mid Cx lesion is 20% stenosed.  A stent was successfully placed.  Prox RCA lesion is 99% stenosed.  Post intervention, there is a 0% residual stenosis.  Prox RCA to Mid RCA lesion is 40% stenosed.  Post intervention, there is a 0% residual stenosis.  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 Successful percutaneous coronary intervention with PTCA and DES stenting with a 3.5 x 34 mm Resolute Onyx stent postdilated to 3.80 milliliters with a 99% stenosis moderate narrowings involving below the lesion being reduced to 0%.  There is brisk TIMI-3 flow with no evidence for dissection. Transient AV block when crossing the aortic valve which responded to atropine. RECOMMENDATION: DAPT for at least 1 year.  Aggressive lipid-lowering therapy with target LDL less than 70.  Optimal blood pressure control with goal less than 130/80 and ideal less than 120/80.   ECHOCARDIOGRAM COMPLETE  Result Date: 01/21/2020    ECHOCARDIOGRAM REPORT   Patient Name:   Guy [name] Date of Exam: 01/21/2020 Medical Rec #:  [id number]          Height:       70.0 in Accession #:    [id number]         Weight:       195.5 lb Date of Birth:  1965/10/19          BSA:          2.067 m Patient Age:    87 years           BP:           146/91 mmHg Patient Gender: M                  HR:           60 bpm. Exam Location:  Inpatient Procedure: 2D Echo, Cardiac Doppler and Color Doppler Indications:    R07.9* Chest pain, unspecified  History:        Patient has no prior history of Echocardiogram examinations.                 Signs/Symptoms:Chest Pain; Risk Factors:Hypertension and                 Diabetes.  Sonographer:    Bernadene Person RDCS Referring Phys: [place]  1. Left ventricular ejection fraction, by estimation, is 60 to 65%. The left ventricle has normal function. The left ventricle has no regional wall motion abnormalities. Left ventricular diastolic parameters are consistent with Grade I diastolic dysfunction (impaired relaxation).  2. Right ventricular systolic function is normal. The right ventricular size is  normal.  3. The mitral valve is  normal in structure. Trivial mitral valve regurgitation. No evidence of mitral stenosis.  4. The aortic valve is tricuspid. Aortic valve regurgitation is not visualized. No aortic stenosis is present.  5. The inferior vena cava is normal in size with greater than 50% respiratory variability, suggesting right atrial pressure of 3 mmHg. FINDINGS  Left Ventricle: Left ventricular ejection fraction, by estimation, is 60 to 65%. The left ventricle has normal function. The left ventricle has no regional wall motion abnormalities. The left ventricular internal cavity size was normal in size. There is  no left ventricular hypertrophy. Left ventricular diastolic parameters are consistent with Grade I diastolic dysfunction (impaired relaxation). Right Ventricle: The right ventricular size is normal. Right ventricular systolic function is normal. Left Atrium: Left atrial size was normal in size. Right Atrium: Right atrial size was normal in size. Pericardium: There is no evidence of pericardial effusion. Mitral Valve: The mitral valve is normal in structure. Mild mitral annular calcification. Trivial mitral valve regurgitation. No evidence of mitral valve stenosis. Tricuspid Valve: The tricuspid valve is normal in structure. Tricuspid valve regurgitation is trivial. No evidence of tricuspid stenosis. Aortic Valve: The aortic valve is tricuspid. Aortic valve regurgitation is not visualized. No aortic stenosis is present. Pulmonic Valve: The pulmonic valve was normal in structure. Pulmonic valve regurgitation is not visualized. No evidence of pulmonic stenosis. Aorta: The aortic root is normal in size and structure. Venous: The inferior vena cava is normal in size with greater than 50% respiratory variability, suggesting right atrial pressure of 3 mmHg.  LEFT VENTRICLE PLAX 2D LVIDd:         5.40 cm  Diastology LVIDs:         3.80 cm  LV e' medial:    6.30 cm/s LV PW:         1.00 cm  LV E/e'  medial:  10.8 LV IVS:        1.00 cm  LV e' lateral:   8.18 cm/s LVOT diam:     1.70 cm  LV E/e' lateral: 8.3 LV SV:         54 LV SV Index:   26 LVOT Area:     2.27 cm  RIGHT VENTRICLE RV S prime:     13.00 cm/s TAPSE (M-mode): 2.0 cm LEFT ATRIUM             Index       RIGHT ATRIUM           Index LA diam:        3.40 cm 1.64 cm/m  RA Area:     10.10 cm LA Vol (A2C):   33.8 ml 16.35 ml/m RA Volume:   19.10 ml  9.24 ml/m LA Vol (A4C):   25.8 ml 12.48 ml/m LA Biplane Vol: 30.5 ml 14.75 ml/m  AORTIC VALVE LVOT Vmax:   122.00 cm/s LVOT Vmean:  84.400 cm/s LVOT VTI:    0.240 m  AORTA Ao Root diam: 3.10 cm Ao Asc diam:  2.90 cm MITRAL VALVE MV Area (PHT): 3.99 cm    SHUNTS MV Decel Time: 190 msec    Systemic VTI:  0.24 m MV E velocity: 68.30 cm/s  Systemic Diam: 1.70 cm MV A velocity: 66.80 cm/s MV E/A ratio:  1.02 Kirk Ruths MD Electronically signed by Kirk Ruths MD Signature Date/Time: 01/21/2020/10:38:40 AM    Final    Disposition   Pt is being discharged home today in good condition.  Follow-up Plans & Appointments  Follow-up Information    Burtis Junes, NP Follow up on 02/06/2020.   Specialties: Nurse Practitioner, Interventional Cardiology, Cardiology, Radiology Why: @ 8:15AM Contact information: [place]. [address] [place] [address] [phone]        [facility] [address] [facility] Follow up on 01/30/2020.   Specialty: Cardiology Why: for blood work 7:30-4:30PM Contact information: [address] [place] [phone]             Discharge Instructions    Amb Referral to Cardiac Rehabilitation   Complete by: As directed    Diagnosis:  Coronary Stents NSTEMI     After initial evaluation and assessments completed: Virtual Based Care may be provided alone or in conjunction with Phase 2 Cardiac Rehab based on patient barriers.: Yes   Diet - low sodium heart healthy   Complete by: As directed     Discharge instructions   Complete by: As directed    No driving for 1 week. No lifting over 10 lbs for 2 weeks. No sexual activity for 2 weeks. You may return to work on Nov 30th with light duty for 1 week Keep procedure site clean & dry. If you notice increased pain, swelling, bleeding or pus, call/return!  You may shower, but no soaking baths/hot tubs/pools for 1 week.   Increase activity slowly   Complete by: As directed       Discharge Medications   Allergies as of 01/23/2020      Reactions   Penicillins Other (See Comments)   Patient states his joints felt like they were locking up  Has patient had a PCN reaction causing immediate rash, facial/tongue/throat swelling, SOB or lightheadedness with hypotension: no Has patient had a PCN reaction causing severe rash involving mucus membranes or skin necrosis: no Has patient had a PCN reaction that required hospitalization: no Has patient had a PCN reaction occurring within the last 10 years: no If all of the above answers are "NO", then may proceed with Cephalosporin use.   Sulfa Antibiotics Other (See Comments)   Patient is unsure of allergy      Medication List    STOP taking these medications   ibuprofen 200 MG tablet Commonly known as: ADVIL     TAKE these medications   amLODipine 2.5 MG tablet Commonly known as: NORVASC Take 1 tablet (2.5 mg total) by mouth daily. Start taking on: January 24, 2020   aspirin 81 MG EC tablet Take 1 tablet (81 mg total) by mouth daily. Swallow whole. Start taking on: January 24, 2020   atorvastatin 80 MG tablet Commonly known as: LIPITOR Take 1 tablet (80 mg total) by mouth daily. Start taking on: January 24, 2020   blood glucose meter kit and supplies Use as instructed   cyclobenzaprine 10 MG tablet Commonly known as: FLEXERIL Take 1 tablet (10 mg total) by mouth 2 (two) times daily as needed for muscle spasms.   GOODY HEADACHE PO Take 1 packet by mouth as needed  (headache/pain).   Lancets Thin Misc 1 each by Does not apply route 4 (four) times daily -  before meals and at bedtime.   losartan 25 MG tablet Commonly known as: COZAAR Take 1 tablet (25 mg total) by mouth daily.   metoprolol succinate 25 MG 24 hr tablet Commonly known as: TOPROL-XL Take 1 tablet (25 mg total) by mouth daily.   nitroGLYCERIN 0.4 MG SL tablet Commonly known as: NITROSTAT Place 1 tablet (  0.4 mg total) under the tongue every 5 (five) minutes x 3 doses as needed for chest pain.   ticagrelor 90 MG Tabs tablet Commonly known as: BRILINTA Take 1 tablet (90 mg total) by mouth 2 (two) times daily.          Outstanding Labs/Studies   BMET in 1 week FLP/LFTs in 6-8 weeks  Duration of Discharge Encounter   Greater than 30 minutes including physician time.  Signed, Jrue Jarriel Ninfa Meeker, PA-C 01/23/2020, 10:38 AM

## 2020-01-23 NOTE — Progress Notes (Signed)
CARDIOLOGY OFFICE NOTE  Date:  02/06/2020    Cedric Fishman Date of Birth: Jun 26, 1965 Medical Record #[id number]  PCP:  Curly Rim, MD  Cardiologist:  Johney Frame (NEW)    Chief Complaint  Patient presents with  . Follow-up  . Hospitalization Follow-up    Seen for Dr. Johney Frame    History of Present Illness:  KHASIR WOODROME is a 54 y.o. male who presents today for a post hospital visit. Seen for Dr. Johney Frame (NEW).   He has a history of previously untreated DM2, prior tobacco abuse, and HTN. FH is positive for CAD with grandfather dying with massive MI at 30 another grandfather with early CABG.   Admitted earlier this month with chest pain/indigestion. Troponin +. BP high. Underwent cardiac cath with multivessel CAD with culprit lesion being the RCA with 99% stenosis. He was treated with DES to RCA. LVEDP noted to be 53mHg. He had transient AV block when crossing the aortic valve which responded to atropine. Post procedure recommendations for DAPT with Aspirin and Brilinta for at least 1 year.  Echo showed LVEF 60-65%, no WMA, G1DD, trivial MR.   Comes in today. Here alone. He has several issues- having nosebleeds - blowing dried blood out. Previously has only taken aspirin every other day due to blowing blood out of his nose - he has seen ENT in the past. He knows that he is committed to DAPT for the next year. Sleeping better. Legs feel better. Was walking 5 to 6 miles a day at work ([employer]) and a mile or so at home - but now notes he is out of breath. He is taking Brilinta with Coke - no real relief. Scared to push himself. He is walking about 10 minutes several times a day. He returned to work this morning. It is getting better but not what he is used to. He notes that this sensation is improving and he would like to stay on current DAPT. No chest pain. Still with some indigestion/burping. His goal is to not take medicines. He is not on PPI therapy. He is not on  Metformin. Little lightheaded at times with getting up too quick. BP readings at home mostly above goal.   Past Medical History:  Diagnosis Date  . Collapsed lung   . DDD (degenerative disc disease), cervical   . Diabetes mellitus without complication ([place]   . Enlarged prostate   . HTN (hypertension)     Past Surgical History:  Procedure Laterality Date  . APPENDECTOMY    . CORONARY STENT INTERVENTION N/A 01/22/2020   Procedure: CORONARY STENT INTERVENTION;  Surgeon: KTroy Sine MD;  [facility];  Service: Cardiovascular;  Laterality: N/A;  . HERNIA REPAIR    . LEFT HEART CATH AND CORONARY ANGIOGRAPHY N/A 01/22/2020   Procedure: LEFT HEART CATH AND CORONARY ANGIOGRAPHY;  Surgeon: KTroy Sine MD;  [facility];  Service: Cardiovascular;  Laterality: N/A;     Medications: Current Meds  Medication Sig  . amLODipine (NORVASC) 2.5 MG tablet Take 1 tablet (2.5 mg total) by mouth daily.  .Marland Kitchenaspirin EC 81 MG EC tablet Take 1 tablet (81 mg total) by mouth daily. Swallow whole.  . Aspirin-Acetaminophen-Caffeine (GOODY HEADACHE PO) Take 1 packet by mouth as needed (headache/pain).  .Marland Kitchenatorvastatin (LIPITOR) 80 MG tablet Take 1 tablet (80 mg total) by mouth daily.  . Blood Glucose Monitoring Suppl (BLOOD GLUCOSE METER) kit Use as instructed  .  Lancets Thin MISC 1 each by Does not apply route 4 (four) times daily -  before meals and at bedtime.  . metoprolol succinate (TOPROL-XL) 25 MG 24 hr tablet Take 1 tablet (25 mg total) by mouth daily.  . nitroGLYCERIN (NITROSTAT) 0.4 MG SL tablet Place 1 tablet (0.4 mg total) under the tongue every 5 (five) minutes x 3 doses as needed for chest pain.  . ticagrelor (BRILINTA) 90 MG TABS tablet Take 1 tablet (90 mg total) by mouth 2 (two) times daily.  . [DISCONTINUED] losartan (COZAAR) 25 MG tablet Take 1 tablet (25 mg total) by mouth daily.     Allergies: Allergies  Allergen Reactions  . Penicillins Other  (See Comments)    Patient states his joints felt like they were locking up  Has patient had a PCN reaction causing immediate rash, facial/tongue/throat swelling, SOB or lightheadedness with hypotension: no Has patient had a PCN reaction causing severe rash involving mucus membranes or skin necrosis: no Has patient had a PCN reaction that required hospitalization: no Has patient had a PCN reaction occurring within the last 10 years: no If all of the above answers are "NO", then may proceed with Cephalosporin use.   . Sulfa Antibiotics Other (See Comments)    Patient is unsure of allergy    Social History: The patient  reports that he has quit smoking. He has never used smokeless tobacco. He reports that he does not drink alcohol and does not use drugs.   Family History: The patient's family history includes CAD in his maternal grandfather and maternal grandmother; Diabetes in an other family member.   Review of Systems: Please see the history of present illness.   All other systems are reviewed and negative.   Physical Exam: VS:  BP 138/64   Pulse (!) 105   Ht 5' 10"  (1.778 m)   Wt 196 lb (88.9 kg)   SpO2 98%   BMI 28.12 kg/m  .  BMI Body mass index is 28.12 kg/m.  Wt Readings from Last 3 Encounters:  02/06/20 196 lb (88.9 kg)  01/23/20 194 lb 6.4 oz (88.2 kg)  12/21/19 198 lb (89.8 kg)    General: Alert. He is in no acute distress.   Cardiac: Regular rate and rhythm. HR down to 88 by my check. No murmurs, rubs, or gallops. No edema.  Respiratory:  Lungs are clear to auscultation bilaterally with normal work of breathing.  GI: Soft and nontender.  MS: No deformity or atrophy. Gait and ROM intact.  Skin: Warm and dry. Color is normal.  Neuro:  Strength and sensation are intact and no gross focal deficits noted.  Psych: Alert, appropriate and with normal affect.   LABORATORY DATA:  EKG:  EKG is not ordered today.    Lab Results  Component Value Date   WBC 13.0 (H)  01/23/2020   HGB 14.4 01/23/2020   HCT 41.3 01/23/2020   PLT 202 01/23/2020   GLUCOSE 146 (H) 01/30/2020   CHOL 144 01/21/2020   TRIG 115 01/21/2020   HDL 28 (L) 01/21/2020   LDLCALC 93 01/21/2020   ALT 27 12/21/2019   AST 27 12/21/2019   NA 136 01/30/2020   K 4.2 01/30/2020   CL 98 01/30/2020   CREATININE 1.24 01/30/2020   BUN 17 01/30/2020   CO2 24 01/30/2020   INR 1.2 01/20/2020   HGBA1C 5.7 (H) 01/20/2020     BNP (last 3 results) No results for input(s): BNP in the last  8760 hours.  ProBNP (last 3 results) No results for input(s): PROBNP in the last 8760 hours.   Other Studies Reviewed Today:  Echo 01/21/20 1. Left ventricular ejection fraction, by estimation, is 60 to 65%. The  left ventricle has normal function. The left ventricle has no regional  wall motion abnormalities. Left ventricular diastolic parameters are  consistent with Grade I diastolic  dysfunction (impaired relaxation).  2. Right ventricular systolic function is normal. The right ventricular  size is normal.  3. The mitral valve is normal in structure. Trivial mitral valve  regurgitation. No evidence of mitral stenosis.  4. The aortic valve is tricuspid. Aortic valve regurgitation is not  visualized. No aortic stenosis is present.  5. The inferior vena cava is normal in size with greater than 50%  respiratory variability, suggesting right atrial pressure of 3 mmHg.   Cardiac cath 01/22/20  Mid LAD to Dist LAD lesion is 50% stenosed.  Mid LAD-1 lesion is 30% stenosed.  Mid LAD-2 lesion is 50% stenosed.  2nd Mrg lesion is 40% stenosed.  Prox Cx to Mid Cx lesion is 20% stenosed.  A stent was successfully placed.  Prox RCA lesion is 99% stenosed.  Post intervention, there is a 0% residual stenosis.  Prox RCA to Mid RCA lesion is 40% stenosed.  Post intervention, there is a 0% residual stenosis.  Multivessel CAD with irregularity of the mid LAD with narrowings of 30 to 50%,  mild 20% narrowing in the proximal circumflex with 40% narrowing in the OM vessel; and large dominant RCA with subtotal thrombotic 99% proximal stenosis in the region of mild to moderate plaque above and below the stenosis and 40% smooth mid PLA stenosis  LVEDP 17 mm Hg.  Successful percutaneous coronary intervention with PTCA and DES stenting with a 3.5 x 34 mm Resolute Onyx stent postdilated to 3.80 milliliters with a 99% stenosis moderate narrowings involving below the lesion being reduced to 0%. There is brisk TIMI-3 flow with no evidence for dissection.  Transient AV block when crossing the aortic valve which responded to atropine.  RECOMMENDATION: DAPT for at least 1 year. Aggressive lipid-lowering therapy with target LDL less than 70. Optimal blood pressure control with goal less than 130/80 and ideal less than 120/80.  Coronary Diagrams  Diagnostic Dominance: Right  Intervention      Assessment/Plan:  1. NSTEMI with multivessel CAD - s/p PCI/DES to RCA - no further chest pain - still with some indigestion/burping - nothing exertional - will add back his PPI - continue other medicines - recheck baseline lab today.   2. DOE/SOB - check BNP today - already using caffeine with Brilinta - seems to be improving but may need to change over to Plavix.   3. HTN - above goal - increasing Losartan to 50 mg a day. Lab today.   4. HLD - now on high dose statin - seems to be tolerating ok.   5. Past tobacco abuse - not smoking currently.   6. Borderline DM - no longer on Metformin due to intolerance. He is quite motivated to continue with his efforts at diet/exercise/weight, etc.   Current medicines are reviewed with the patient today.  The patient does not have concerns regarding medicines other than what has been noted above.  The following changes have been made:  See above.  Labs/ tests ordered today include:    Orders Placed This Encounter  Procedures  . Basic  metabolic panel  . CBC  . Pro  b natriuretic peptide (BNP)     Disposition:   FU with Dr. Johney Frame in about 3 weeks. Labs today to include BNP. Sent message to cardiac rehab. Increasing Losartan for better BP control. Adding back PPI for at least one month.    Patient is agreeable to this plan and will call if any problems develop in the interim.   SignedTruitt Merle, NP  02/06/2020 8:43 AM  [facility] [address] [place], [place]  [address] Phone: [phone] Fax: [phone]

## 2020-01-23 NOTE — Progress Notes (Signed)
CARDIAC REHAB PHASE I   Offered to walk with pt. Pt states he has been ambulating independently without difficulty. Pt states some gasping and SOB due to Brilinta, encouraged caffeine intake. Pt educated on importance of ASA, Brilinta, statin, and NTG. Pt given  MI book along with heart healthy and diabetic diets. Reviewed site care, restrictions, and exercise guidelines. Will refer to CRP II [place].   [id number] Reynold Bowen, RN BSN 01/23/2020 10:04 AM

## 2020-01-23 NOTE — Discharge Instructions (Signed)
Heart Attack A heart attack occurs when blood and oxygen supply to the heart is cut off. A heart attack causes damage to the heart that cannot be fixed. A heart attack is also called a myocardial infarction, or MI. If you think you are having a heart attack, do not wait to see if the symptoms will go away. Get medical help right away. What are the causes? This condition may be caused by:  A fatty substance (plaque) in the blood vessels (arteries). This can block the flow of blood to the heart.  A blood clot in the blood vessels that go to the heart. The blood clot blocks blood flow.  Low blood pressure.  An abnormal heartbeat.  Some diseases, such as problems in red blood cells (anemia)orproblems in breathing (respiratory failure).  Tightening (spasm) of a blood vessel that cuts off blood to the heart.  A tear in a blood vessel of the heart.  High blood pressure. What increases the risk? The following factors may make you more likely to develop this condition:  Aging. The older you are, the higher your risk.  Having a personal or family history of chest pain, heart attack, stroke, or narrowing of the arteries in the legs, arms, head, or stomach (peripheral artery disease).  Being male.  Smoking.  Not getting regular exercise.  Being overweight or obese.  Having high blood pressure.  Having high cholesterol.  Having diabetes.  Drinking too much alcohol.  Using illegal drugs, such as cocaine or methamphetamine. What are the signs or symptoms? Symptoms of this condition include:  Chest pain. It may feel like: ? Crushing or squeezing. ? Tightness, pressure, fullness, or heaviness.  Pain in the arm, neck, jaw, back, or upper body.  Shortness of breath.  Heartburn.  Upset stomach (indigestion).  Feeling like you may vomit (nauseous).  Cold sweats.  Feeling tired.  Sudden light-headedness. How is this treated? A heart attack must be treated as soon as  possible. Treatment may include:  Medicines to: ? Break up or dissolve blood clots. ? Thin blood and help prevent blood clots. ? Treat blood pressure. ? Improve blood flow to the heart. ? Reduce pain. ? Reduce cholesterol.  Procedures to widen a blocked artery and keep it open.  Open heart surgery.  Receiving oxygen.  Making your heart strong again (cardiac rehabilitation) through exercise, education, and counseling. Follow these instructions at home: Medicines  Take over-the-counter and prescription medicines only as told by your doctor. You may need to take medicine: ? To keep your blood from clotting too easily. ? To control blood pressure. ? To lower cholesterol. ? To control heart rhythms.  Do not take these medicines unless your doctor says it is okay: ? NSAIDs, such as ibuprofen. ? Supplements that have vitamin A, vitamin E, or both. ? Hormone replacement therapy that has estrogen with or without progestin. Lifestyle      Do not use any products that have nicotine or tobacco, such as cigarettes, e-cigarettes, and chewing tobacco. If you need help quitting, ask your doctor.  Avoid secondhand smoke.  Exercise regularly. Ask your doctor about a cardiac rehab program.  Eat heart-healthy foods. Your doctor will tell you what foods to eat.  Stay at a healthy weight.  Lower your stress level.  Do not use illegal drugs. Alcohol use  Do not drink alcohol if: ? Your doctor tells you not to drink. ? You are pregnant, may be pregnant, or are planning to become pregnant.    If you drink alcohol: ? Limit how much you use to:  0-1 drink a day for women.  0-2 drinks a day for men. ? Know how much alcohol is in your drink. In the [place] one drink equals one 12 oz bottle of beer (355 mL), one 5 oz glass of wine (148 mL), or one 1 oz glass of hard liquor (44 mL). General instructions  Work with your doctor to treat other problems you may have, such as diabetes or high  blood pressure.  Get screened for depression. Get treatment if needed.  Keep your vaccines up to date. Get the flu shot (influenza vaccine) every year.  Keep all follow-up visits as told by your doctor. This is important. Contact a doctor if:  You feel very sad.  You have trouble doing your daily activities. Get help right away if:  You have sudden, unexplained discomfort in your chest, arms, back, neck, jaw, or upper body.  You have shortness of breath.  You have sudden sweating or clammy skin.  You feel like you may vomit.  You vomit.  You feel tired or weak.  You get light-headed or dizzy.  You feel your heart beating fast.  You feel your heart skipping beats.  You have blood pressure that is higher than 180/120. These symptoms may be an emergency. Do not wait to see if the symptoms will go away. Get medical help right away. Call your local emergency services (911 in the [place] Do not drive yourself to the hospital. Summary  A heart attack occurs when blood and oxygen supply to the heart is cut off.  Do not take NSAIDs unless your doctor says it is okay.  Do not smoke. Avoid secondhand smoke.  Exercise regularly. Ask your doctor about a cardiac rehab program. This information is not intended to replace advice given to you by your health care provider. Make sure you discuss any questions you have with your health care provider. Document Revised: 06/06/2018 Document Reviewed: 06/06/2018 Elsevier Patient Education  2020 [employer]  

## 2020-01-23 NOTE — Telephone Encounter (Signed)
Left message to call back.  Order in for BMET, just needs to be scheduled.

## 2020-01-23 NOTE — Progress Notes (Addendum)
Progress Note  Patient Name: Guy [name] Date of Encounter: 01/23/2020  [facility] Cardiologist: Meriam Sprague, MD   Subjective   S/p DES to RCA. Patient had some sob overnight. Indigestion this morning. Cath site, right wrist, is stable.   Inpatient Medications    Scheduled Meds: . amLODipine  2.5 mg Oral Daily  . aspirin EC  81 mg Oral Daily  . atorvastatin  80 mg Oral Daily  . insulin aspart  0-15 Units Subcutaneous TID WC  . insulin aspart  0-5 Units Subcutaneous QHS  . metoprolol tartrate  25 mg Oral BID  . sodium chloride flush  3 mL Intravenous Q12H  . sodium chloride flush  3 mL Intravenous Q12H  . sodium chloride flush  3 mL Intravenous Q12H  . ticagrelor  90 mg Oral BID   Continuous Infusions: . sodium chloride    . sodium chloride 90 mL/hr at 01/22/20 1710  . sodium chloride    . nitroGLYCERIN Stopped (01/21/20 0010)   PRN Meds: sodium chloride, sodium chloride, acetaminophen, diazepam, melatonin, nitroGLYCERIN, ondansetron (ZOFRAN) IV, sodium chloride flush, sodium chloride flush   Vital Signs    Vitals:   01/22/20 2144 01/22/20 2211 01/23/20 0442 01/23/20 0527  BP: 139/76 132/76 136/82   Pulse: 61 61 76   Resp: (!) 21  20   Temp: 98.6 F (37 C)  98.4 F (36.9 C)   TempSrc: Oral  Oral   SpO2: 98%     Weight:    88.2 kg  Height:        Intake/Output Summary (Last 24 hours) at 01/23/2020 0745 Last data filed at 01/23/2020 1478 Gross per 24 hour  Intake 1066 ml  Output 3200 ml  Net -2134 ml   Last 3 Weights 01/23/2020 01/22/2020 01/21/2020  Weight (lbs) 194 lb 6.4 oz 192 lb 0.3 oz 195 lb 8.8 oz  Weight (kg) 88.179 kg 87.1 kg 88.7 kg      Telemetry    SR, HR 50-60s - Personally Reviewed  ECG    NSR, 63bpm, LAD, nonspecific T wave abnormality - Personally Reviewed  Physical Exam   GEN: No acute distress.   Neck: No JVD Cardiac: RRR, no murmurs, rubs, or gallops.  Respiratory: Clear to auscultation bilaterally. GI:  Soft, nontender, non-distended  MS: No edema; No deformity. Neuro:  Nonfocal  Psych: Normal affect   Labs    High Sensitivity Troponin:   Recent Labs  Lab 01/20/20 0619 01/20/20 0819 01/22/20 1217  TROPONINIHS 390* 774* 546*      Chemistry Recent Labs  Lab 01/20/20 0619 01/22/20 0442 01/23/20 0241  NA 137 134* 137  K 3.8 4.0 3.8  CL 99 102 106  CO2 30 24 22   GLUCOSE 123* 105* 112*  BUN 15 17 16   CREATININE 1.27* 1.07 1.17  CALCIUM 9.8 8.8* 8.7*  GFRNONAA >60 >60 >60  ANIONGAP 8 8 9      Hematology Recent Labs  Lab 01/21/20 0308 01/22/20 0442 01/23/20 0241  WBC 11.5* 10.5 13.0*  RBC 4.88 4.84 4.66  HGB 14.9 14.8 14.4  HCT 43.2 42.8 41.3  MCV 88.5 88.4 88.6  MCH 30.5 30.6 30.9  MCHC 34.5 34.6 34.9  RDW 12.3 12.3 12.5  PLT 196 197 202    BNPNo results for input(s): BNP, PROBNP in the last 168 hours.   DDimer No results for input(s): DDIMER in the last 168 hours.   Radiology    CARDIAC CATHETERIZATION  Result Date: 01/22/2020  Mid LAD to Dist LAD lesion is 50% stenosed.  Mid LAD-1 lesion is 30% stenosed.  Mid LAD-2 lesion is 50% stenosed.  2nd Mrg lesion is 40% stenosed.  Prox Cx to Mid Cx lesion is 20% stenosed.  A stent was successfully placed.  Prox RCA lesion is 99% stenosed.  Post intervention, there is a 0% residual stenosis.  Prox RCA to Mid RCA lesion is 40% stenosed.  Post intervention, there is a 0% residual stenosis.  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 Successful percutaneous coronary intervention with PTCA and DES stenting with a 3.5 x 34 mm Resolute Onyx stent postdilated to 3.80 milliliters with a 99% stenosis moderate narrowings  involving below the lesion being reduced to 0%.  There is brisk TIMI-3 flow with no evidence for dissection. Transient AV block when crossing the aortic valve which responded to atropine. RECOMMENDATION: DAPT for at least 1 year.  Aggressive lipid-lowering therapy with target LDL less than 70.  Optimal blood pressure control with goal less than 130/80 and ideal less than 120/80.   ECHOCARDIOGRAM COMPLETE  Result Date: 01/21/2020    ECHOCARDIOGRAM REPORT   Patient Name:   Guy [name] Date of Exam: 01/21/2020 Medical Rec #:  [id number]          Height:       70.0 in Accession #:    [id number]         Weight:       195.5 lb Date of Birth:  1965-10-17          BSA:          2.067 m Patient Age:    54 years           BP:           146/91 mmHg Patient Gender: M                  HR:           60 bpm. Exam Location:  Inpatient Procedure: 2D Echo, Cardiac Doppler and Color Doppler Indications:    R07.9* Chest pain, unspecified  History:        Patient has no prior history of Echocardiogram examinations.                 Signs/Symptoms:Chest Pain; Risk Factors:Hypertension and                 Diabetes.  Sonographer:    Eulah PontSarah Pirrotta RDCS Referring Phys: [id number] JAMES ALLRED IMPRESSIONS  1. Left ventricular ejection fraction, by estimation, is 60 to 65%. The left ventricle has normal function. The left ventricle has no regional wall motion abnormalities. Left ventricular diastolic parameters are consistent with Grade I diastolic dysfunction (impaired relaxation).  2. Right ventricular systolic function is normal. The right ventricular size is normal.  3. The mitral valve is normal in structure. Trivial mitral valve regurgitation. No evidence of mitral stenosis.  4. The aortic valve is tricuspid. Aortic valve regurgitation is not visualized. No aortic stenosis is present.  5. The inferior vena cava is normal in size with greater than 50% respiratory variability, suggesting right atrial pressure of 3 mmHg. FINDINGS  Left  Ventricle: Left ventricular ejection fraction, by estimation, is 60 to 65%. The left ventricle has normal function. The left ventricle has no regional wall motion abnormalities. The left ventricular internal cavity size was normal in size. There is  no left ventricular hypertrophy. Left ventricular diastolic parameters are consistent with Grade I diastolic dysfunction (impaired relaxation). Right Ventricle: The right ventricular size is normal. Right ventricular systolic function is normal. Left Atrium: Left atrial size was normal in size. Right Atrium: Right atrial size was normal in size. Pericardium: There is no evidence of pericardial effusion. Mitral Valve: The mitral valve is normal in structure. Mild mitral annular calcification. Trivial mitral valve regurgitation. No evidence of mitral valve stenosis. Tricuspid Valve: The tricuspid valve is normal in structure. Tricuspid valve regurgitation is trivial. No evidence of tricuspid stenosis. Aortic Valve: The aortic valve is tricuspid. Aortic valve regurgitation is not visualized. No aortic stenosis is present. Pulmonic Valve: The pulmonic valve was normal in structure. Pulmonic valve regurgitation is not visualized. No evidence of pulmonic stenosis. Aorta: The aortic root is normal in size and structure. Venous: The inferior vena cava is normal in size with greater than 50% respiratory variability, suggesting right atrial pressure of 3 mmHg.  LEFT VENTRICLE PLAX 2D LVIDd:         5.40 cm  Diastology LVIDs:         3.80 cm  LV e' medial:    6.30 cm/s LV PW:         1.00 cm  LV E/e' medial:  10.8 LV IVS:        1.00 cm  LV e' lateral:   8.18 cm/s LVOT diam:     1.70 cm  LV E/e' lateral: 8.3 LV SV:         54 LV SV Index:   26 LVOT Area:     2.27 cm  RIGHT VENTRICLE RV S prime:     13.00 cm/s TAPSE (M-mode): 2.0 cm LEFT ATRIUM             Index       RIGHT ATRIUM           Index LA diam:        3.40 cm 1.64 cm/m  RA Area:     10.10 cm LA Vol (A2C):   33.8 ml  16.35 ml/m RA Volume:   19.10 ml  9.24 ml/m LA Vol (A4C):   25.8 ml 12.48 ml/m LA Biplane Vol: 30.5 ml 14.75 ml/m  AORTIC VALVE LVOT Vmax:   122.00 cm/s LVOT Vmean:  84.400 cm/s LVOT VTI:    0.240 m  AORTA Ao Root diam: 3.10 cm Ao Asc diam:  2.90 cm MITRAL VALVE MV Area (PHT): 3.99 cm    SHUNTS MV Decel Time: 190 msec    Systemic VTI:  0.24 m MV E velocity: 68.30 cm/s  Systemic Diam: 1.70 cm MV A velocity: 66.80 cm/s MV E/A ratio:  1.02 Olga Millers MD Electronically signed by Olga Millers MD Signature Date/Time: 01/21/2020/10:38:40 AM    Final     Cardiac Studies   Echo 01/21/20 1. Left ventricular ejection fraction, by estimation, is 60 to 65%. The  left ventricle has normal function. The left ventricle has no regional  wall motion abnormalities. Left ventricular diastolic parameters are  consistent with Grade I diastolic  dysfunction (impaired relaxation).  2. Right ventricular systolic function is normal. The right ventricular  size is normal.  3. The mitral valve is normal  in structure. Trivial mitral valve  regurgitation. No evidence of mitral stenosis.  4. The aortic valve is tricuspid. Aortic valve regurgitation is not  visualized. No aortic stenosis is present.  5. The inferior vena cava is normal in size with greater than 50%  respiratory variability, suggesting right atrial pressure of 3 mmHg.   Cardiac cath 01/22/20  Mid LAD to Dist LAD lesion is 50% stenosed.  Mid LAD-1 lesion is 30% stenosed.  Mid LAD-2 lesion is 50% stenosed.  2nd Mrg lesion is 40% stenosed.  Prox Cx to Mid Cx lesion is 20% stenosed.  A stent was successfully placed.  Prox RCA lesion is 99% stenosed.  Post intervention, there is a 0% residual stenosis.  Prox RCA to Mid RCA lesion is 40% stenosed.  Post intervention, there is a 0% residual stenosis.   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  Successful percutaneous coronary intervention with PTCA and DES stenting with a 3.5 x 34 mm Resolute Onyx stent postdilated to 3.80 milliliters with a 99% stenosis moderate narrowings involving below the lesion being reduced to 0%.  There is brisk TIMI-3 flow with no evidence for dissection.  Transient AV block when crossing the aortic valve which responded to atropine.  RECOMMENDATION: DAPT for at least 1 year.  Aggressive lipid-lowering therapy with target LDL less than 70.  Optimal blood pressure control with goal less than 130/80 and ideal less than 120/80.  Coronary Diagrams  Diagnostic Dominance: Right  Intervention     Patient Profile     54 y.o. male with pmh of untreated DM, HTN who was admitted with chest pain worrisome for ACS.   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  - continue aspirin, bb, statin - creatinine 1.07>1.17 - cath site, right wrist, is stable.  - Cardiac rehab to work with patient  HTN - Lopressor 25mg  BID - ACE held for AKI - amlodipine added and BP improved this AM, 136/82 - creatinine 1.17, can likely add ACE back and check BMET in 1 week  HLD - LDL 93, goal <70 - Atorvastatin 80 mg daily  DM2 - SSI - A1C 5.7  AKI - mild bump 1.07>1.17  For questions or updates, please contact [facility] Please consult [email or web address] for contact info under        Signed, Cadence , PA-C  01/23/2020, 7:45 AM    Patient  seen and examined and agree with Cadence 01/25/2020, PA-C as detailed above.  Doing well this morning and wanting to go home. Cath site stable.    S/p PCI to prox-mid RCA. 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  Exam: GEN: No acute distress.   Neck: No JVD Cardiac: RRR, no murmurs, rubs, or gallops.  Respiratory: Clear to auscultation bilaterally. GI: Soft, nontender, non-distended  MS:  right wrist access site c/d/i Neuro:  Nonfocal  Psych: Normal affect   Plan: -Discharge home today  -Continue DAPT for at least 1 year -Atorvastatin  with goal LDL<70 -Metoprolol  XL and losartan  daily; titrate as needed as out-patient -Cardiac Rehab phase II -Will arrange for Cardiology follow-up; labs to monitor renal function at that time  Laurance Flatten, MD

## 2020-01-25 ENCOUNTER — Telehealth (HOSPITAL_COMMUNITY): Payer: Self-pay

## 2020-01-25 ENCOUNTER — Telehealth: Payer: Self-pay | Admitting: Internal Medicine

## 2020-01-25 NOTE — Telephone Encounter (Signed)
Returned call to [employer] and spoke with Olegario Messier. Advised her that paperwork needs to be faxed to our office at [phone]. She states paperwork was faxed this morning and I verified the fax number and advised her that once the paperwork is received, it will be reviewed by a provider and faxed back. She thanked me for the call.

## 2020-01-25 NOTE — Telephone Encounter (Signed)
Guy [name] is calling from [employer] requesting a call from a nurse to confirm if the patient has been put on work leave due to his recent hospitalization and procedure. He is also needing to know if he has any restrictions, limitations, as well as his diagnosis. The claim # for calling back is [id number]. Please advise .

## 2020-01-25 NOTE — Telephone Encounter (Signed)
Looks like Dr. Shari Prows is primary card.

## 2020-01-25 NOTE — Telephone Encounter (Signed)
Pt insurance is active and benefits verified through [facility]. Co-pay $0.00, DED $1,500.00/$264.53 met, out of pocket $3,500.00/$264.53 met, co-insurance 20%. No pre-authorization required. Passport, 01/25/20 @ 2:21PM, GYB#[id number]  Will contact patient to see if he is interested in the Cardiac Rehab Program. If interested, patient will need to complete follow up appt. Once completed, patient will be contacted for scheduling upon review by the RN Navigator.

## 2020-01-25 NOTE — Telephone Encounter (Signed)
Called and spoke with pt in regards to CR, pt stated he is not interested at this time.   Closed referral 

## 2020-01-25 NOTE — Telephone Encounter (Signed)
Left detailed message for patient to call back to schedule lab appointment 11/22-11/24, prior to office visit on 11/30 with Norma Fredrickson, NP.

## 2020-01-25 NOTE — Telephone Encounter (Signed)
Sorry, I am not a nurse & cannot answer these questions.

## 2020-01-29 NOTE — Telephone Encounter (Signed)
Guy [name] with [employer] is calling to check on the status of this request. He states the paperwork was sent on 01/25/20 and they have not received it back yet. Guy [name] was also requesting that someone 'verify when the patients device stopped working.'  Please call, reference number is [id number].   Thank you!

## 2020-01-30 ENCOUNTER — Telehealth: Payer: Self-pay | Admitting: Nurse Practitioner

## 2020-01-30 ENCOUNTER — Other Ambulatory Visit: Payer: Self-pay

## 2020-01-30 ENCOUNTER — Other Ambulatory Visit: Payer: 59 | Admitting: *Deleted

## 2020-01-30 DIAGNOSIS — Z0279 Encounter for issue of other medical certificate: Secondary | ICD-10-CM

## 2020-01-30 DIAGNOSIS — N289 Disorder of kidney and ureter, unspecified: Secondary | ICD-10-CM

## 2020-01-30 LAB — BASIC METABOLIC PANEL
BUN/Creatinine Ratio: 14 (ref 9–20)
BUN: 17 mg/dL (ref 6–24)
CO2: 24 mmol/L (ref 20–29)
Calcium: 9 mg/dL (ref 8.7–10.2)
Chloride: 98 mmol/L (ref 96–106)
Creatinine, Ser: 1.24 mg/dL (ref 0.76–1.27)
GFR calc Af Amer: 76 mL/min/{1.73_m2} (ref >59)
GFR calc non Af Amer: 65 mL/min/{1.73_m2} (ref >59)
Glucose: 146 mg/dL — ABNORMAL HIGH (ref 65–99)
Potassium: 4.2 mmol/L (ref 3.5–5.2)
Sodium: 136 mmol/L (ref 134–144)

## 2020-01-30 NOTE — Telephone Encounter (Signed)
I did get in touch with Mr. Guy [name] he will come to the [facility] st office to make payment and sign release form. Thanks

## 2020-01-30 NOTE — Telephone Encounter (Signed)
Medical Records  Received STD paperwork 01/30/20. L/M on Mr. Guy [name] number about payment and Release Form.

## 2020-01-30 NOTE — Telephone Encounter (Addendum)
MR Received payment and release form. STD paperwork place in Andale Gerhardt's mail box 11/23 MR Received Completed STD paperwork from Norma Fredrickson L/M with patient that it was fixed to [phone] as per Mr. Honaker. Placed in Red folder in medical records waiting for pick-up by Patient  jc 02/12/20.

## 2020-02-05 ENCOUNTER — Telehealth: Payer: Self-pay

## 2020-02-05 NOTE — Telephone Encounter (Signed)
The patient has been notified of the result and verbalized understanding.  All questions (if any) were answered. Leanord Hawking, RN 02/05/2020 8:57 AM

## 2020-02-05 NOTE — Telephone Encounter (Signed)
-----   Message from Cadence David Stall, PA-C sent at 02/02/2020 10:22 AM EST ----- Please call to inform blood work looks stable and continue current medications

## 2020-02-06 ENCOUNTER — Other Ambulatory Visit: Payer: Self-pay

## 2020-02-06 ENCOUNTER — Telehealth (HOSPITAL_COMMUNITY): Payer: Self-pay | Admitting: *Deleted

## 2020-02-06 ENCOUNTER — Encounter: Payer: Self-pay | Admitting: Nurse Practitioner

## 2020-02-06 ENCOUNTER — Ambulatory Visit (INDEPENDENT_AMBULATORY_CARE_PROVIDER_SITE_OTHER): Payer: 59 | Admitting: Nurse Practitioner

## 2020-02-06 VITALS — BP 138/64 | HR 105 | Ht 70.0 in | Wt 196.0 lb

## 2020-02-06 DIAGNOSIS — I10 Essential (primary) hypertension: Secondary | ICD-10-CM

## 2020-02-06 DIAGNOSIS — R0600 Dyspnea, unspecified: Secondary | ICD-10-CM | POA: Diagnosis not present

## 2020-02-06 DIAGNOSIS — I214 Non-ST elevation (NSTEMI) myocardial infarction: Secondary | ICD-10-CM

## 2020-02-06 DIAGNOSIS — R0609 Other forms of dyspnea: Secondary | ICD-10-CM

## 2020-02-06 MED ORDER — LOSARTAN POTASSIUM 50 MG PO TABS: 50.0000 mg | ORAL_TABLET | Freq: Every day | ORAL | 3 refills | Status: DC

## 2020-02-06 MED ORDER — PANTOPRAZOLE SODIUM 40 MG PO TBEC: 40.0000 mg | DELAYED_RELEASE_TABLET | Freq: Every day | ORAL | 11 refills | Status: DC

## 2020-02-06 NOTE — Patient Instructions (Addendum)
After Visit Summary:  We will be checking the following labs today - BMET, CBC and BNP   Medication Instructions:    Continue with your current medicines. BUT  I am adding Protonix 40 mg a day for one month  I am increasing the Losartan to 50 mg a day - you can take 2 of your 25 mg tablets once a day and use those up - I have sent the 50 mg RX to your pharmacy  Check on your mail order.    If you need a refill on your cardiac medications before your next appointment, please call your pharmacy.     Testing/Procedures To Be Arranged:  N/A  Follow-Up:   See Dr. Shari Prows in about 3 weeks    At [facility], you and your health needs are our priority.  As part of our continuing mission to provide you with exceptional heart care, we have created designated Provider Care Teams.  These Care Teams include your primary Cardiologist (physician) and Advanced Practice Providers (APPs -  Physician Assistants and Nurse Practitioners) who all work together to provide you with the care you need, when you need it.  Special Instructions:  . Stay safe, wash your hands for at least 20 seconds and wear a mask when needed.  . It was good to talk with you today.  . I sent a message to cardiac rehab - they should be contacting you.  Marland Kitchen Keep a check on your BP for [place].    Call the [facility] office at [phone] if you have any questions, problems or concerns.

## 2020-02-06 NOTE — Telephone Encounter (Signed)
Left message to call cardiac rehab regarding phase 2 cardiac rehab.Gladstone Lighter, RN,BSN 02/06/2020 8:53 AM

## 2020-02-07 LAB — BASIC METABOLIC PANEL
BUN/Creatinine Ratio: 13 (ref 9–20)
BUN: 17 mg/dL (ref 6–24)
CO2: 21 mmol/L (ref 20–29)
Calcium: 9.3 mg/dL (ref 8.7–10.2)
Chloride: 98 mmol/L (ref 96–106)
Creatinine, Ser: 1.29 mg/dL — ABNORMAL HIGH (ref 0.76–1.27)
GFR calc Af Amer: 72 mL/min/{1.73_m2} (ref >59)
GFR calc non Af Amer: 62 mL/min/{1.73_m2} (ref >59)
Glucose: 136 mg/dL — ABNORMAL HIGH (ref 65–99)
Potassium: 4.1 mmol/L (ref 3.5–5.2)
Sodium: 135 mmol/L (ref 134–144)

## 2020-02-07 LAB — CBC
Hematocrit: 43.7 % (ref 37.5–51.0)
Hemoglobin: 15.2 g/dL (ref 13.0–17.7)
MCH: 30.8 pg (ref 26.6–33.0)
MCHC: 34.8 g/dL (ref 31.5–35.7)
MCV: 89 fL (ref 79–97)
Platelets: 320 10*3/uL (ref 150–450)
RBC: 4.93 x10E6/uL (ref 4.14–5.80)
RDW: 12 % (ref 11.6–15.4)
WBC: 13.4 10*3/uL — ABNORMAL HIGH (ref 3.4–10.8)

## 2020-02-07 LAB — PRO B NATRIURETIC PEPTIDE: NT-Pro BNP: 77 pg/mL (ref 0–121)

## 2020-02-19 ENCOUNTER — Other Ambulatory Visit: Payer: Self-pay

## 2020-02-19 MED ORDER — ATORVASTATIN CALCIUM 80 MG PO TABS: 80.0000 mg | ORAL_TABLET | Freq: Every day | ORAL | 3 refills | Status: DC

## 2020-02-19 MED ORDER — AMLODIPINE BESYLATE 2.5 MG PO TABS: 2.5000 mg | ORAL_TABLET | Freq: Every day | ORAL | 3 refills | Status: DC

## 2020-02-19 MED ORDER — LOSARTAN POTASSIUM 50 MG PO TABS: 50.0000 mg | ORAL_TABLET | Freq: Every day | ORAL | 3 refills | Status: DC

## 2020-02-19 MED ORDER — TICAGRELOR 90 MG PO TABS: 90.0000 mg | ORAL_TABLET | Freq: Two times a day (BID) | ORAL | 3 refills | Status: DC

## 2020-02-19 MED ORDER — METOPROLOL SUCCINATE ER 25 MG PO TB24: 25.0000 mg | ORAL_TABLET | Freq: Every day | ORAL | 3 refills | Status: DC

## 2020-02-19 NOTE — Telephone Encounter (Signed)
Pt's medications was sent to pt's pharmacy as requested. Confirmation received.  

## 2020-02-21 NOTE — Progress Notes (Signed)
Cardiology Office Note:    Date:  02/23/2020   ID:  Guy HsuWilliam D Zylstra, DOB 23-Jul-1965, MRN [id number]  PCP:  Vivien Prestoorrington, Kip A, MD  [facility] Cardiologist:  Meriam SpragueHeather E Garion Wempe, MD  [facility] Electrophysiologist:  None   Referring MD: Vivien Prestoorrington, Kip A, MD     History of Present Illness:    Guy [name] is a 54 y.o. male with a hx of HTN, HLD, DMII (poorly controlled) and recent NSTEMI s/p PCI to RCA who presents to clinic for follow-up.  Patient [facility] early November for episode of chest pain/indigestion. Trop elevated at 774 consistent with NSTEMI. Underwent LHC which showed multivessel CAD with culprit lesion being the RCA with 99% stenosis. He was treated with DES to RCA. LVEDP noted to be 17mmHg. He had transient AV block when crossing the aortic valve which responded to atropine. Post procedure recommendations for DAPT with Aspirin and Brilinta for at least 1 year. Echo showed LVEF 60-65%, no WMA, G1DD, trivial MR.   He saw Norma FredricksonLori Gerhardt on 11/30 where he was complaining of some SOB and nosebleeds since starting DAPT. He was already taking his brilinta with caffeine. He wanted to continue current medications and trend to see if his symptoms improve.   Today, he continues to have shortness of breath. This is occurring intermittently throughout the day and is not exertional in nature. Feels like he "needs to make himself breathe." He is able to stay active but still being bothered by the SOB. He knows it is likely related to the ticagrelor and wants to continue taking it for now.  Also continues to have intermittent nose bleeds. Using a humidifier which helps.   Otherwise doing well. He remains very active and walks 5-7 miles per day. No chest pain, lightheadedness, dizziness or syncope. Bps running from 110-140s at home. Mainly 120-130s.   Past Medical History:  Diagnosis Date  . Collapsed lung   . DDD (degenerative disc disease),  cervical   . Diabetes mellitus without complication (HCC)   . Enlarged prostate   . HTN (hypertension)     Past Surgical History:  Procedure Laterality Date  . APPENDECTOMY    . CORONARY STENT INTERVENTION N/A 01/22/2020   Procedure: CORONARY STENT INTERVENTION;  Surgeon: Lennette BihariKelly, Thomas A, MD;  Location: [facility] INVASIVE CV LAB;  Service: Cardiovascular;  Laterality: N/A;  . HERNIA REPAIR    . LEFT HEART CATH AND CORONARY ANGIOGRAPHY N/A 01/22/2020   Procedure: LEFT HEART CATH AND CORONARY ANGIOGRAPHY;  Surgeon: Lennette BihariKelly, Thomas A, MD;  Location: [facility] INVASIVE CV LAB;  Service: Cardiovascular;  Laterality: N/A;    Current Medications: Current Meds  Medication Sig  . amLODipine (NORVASC) 2.5 MG tablet Take 1 tablet (2.5 mg total) by mouth daily.  Marland Kitchen. aspirin EC 81 MG EC tablet Take 1 tablet (81 mg total) by mouth daily. Swallow whole.  . Aspirin-Acetaminophen-Caffeine (GOODY HEADACHE PO) Take 1 packet by mouth as needed (headache/pain).  Marland Kitchen. atorvastatin (LIPITOR) 80 MG tablet Take 1 tablet (80 mg total) by mouth daily.  Marland Kitchen. losartan (COZAAR) 50 MG tablet Take 1 tablet (50 mg total) by mouth daily.  . metoprolol succinate (TOPROL-XL) 25 MG 24 hr tablet Take 1 tablet (25 mg total) by mouth daily.  . nitroGLYCERIN (NITROSTAT) 0.4 MG SL tablet Place 1 tablet (0.4 mg total) under the tongue every 5 (five) minutes x 3 doses as needed for chest pain.  . pantoprazole (PROTONIX) 40 MG tablet Take  1 tablet (40 mg total) by mouth daily.  . ticagrelor (BRILINTA) 90 MG TABS tablet Take 1 tablet (90 mg total) by mouth 2 (two) times daily.     Allergies:   Penicillins and Sulfa antibiotics   Social History   Socioeconomic History  . Marital status: Divorced    Spouse name: Not on file  . Number of children: Not on file  . Years of education: Not on file  . Highest education level: Not on file  Occupational History  . Not on file  Tobacco Use  . Smoking status: Former Games developer  . Smokeless tobacco: Never  Used  Substance and Sexual Activity  . Alcohol use: No  . Drug use: No  . Sexual activity: Yes  Other Topics Concern  . Not on file  Social History Narrative   Lives in [place].  Works at [employer]: Not on [employer] Insecurity: Not on file  Transportation Needs: Not on file  Physical Activity: Not on file  Stress: Not on file  Social Connections: Not on file     Family History: The patient's family history includes CAD in his maternal grandfather and maternal grandmother; Diabetes in an other family member.  ROS:   Please see the history of present illness.    Review of Systems  Constitutional: Negative for chills and fever.  HENT: Positive for nosebleeds.   Eyes: Negative for blurred vision.  Respiratory: Positive for shortness of breath. Negative for sputum production.   Cardiovascular: Negative for chest pain, palpitations, orthopnea, claudication, leg swelling and PND.  Gastrointestinal: Negative for heartburn, nausea and vomiting.  Genitourinary: Negative for dysuria.  Musculoskeletal: Negative for myalgias.  Neurological: Negative for dizziness and loss of consciousness.  Endo/Heme/Allergies: Negative for polydipsia.  Psychiatric/Behavioral: Negative for substance abuse.    EKGs/Labs/Other Studies Reviewed:    The following studies were reviewed today: Echo 01/21/20 1. Left ventricular ejection fraction, by estimation, is 60 to 65%. The  left ventricle has normal function. The left ventricle has no regional  wall motion abnormalities. Left ventricular diastolic parameters are  consistent with Grade I diastolic  dysfunction (impaired relaxation).  2. Right ventricular systolic function is normal. The right ventricular  size is normal.  3. The mitral valve is normal in structure. Trivial mitral valve  regurgitation. No evidence of mitral stenosis.  4. The aortic valve is tricuspid. Aortic valve  regurgitation is not  visualized. No aortic stenosis is present.  5. The inferior vena cava is normal in size with greater than 50%  respiratory variability, suggesting right atrial pressure of 3 mmHg.   Cardiac cath 01/22/20  Mid LAD to Dist LAD lesion is 50% stenosed.  Mid LAD-1 lesion is 30% stenosed.  Mid LAD-2 lesion is 50% stenosed.  2nd Mrg lesion is 40% stenosed.  Prox Cx to Mid Cx lesion is 20% stenosed.  A stent was successfully placed.  Prox RCA lesion is 99% stenosed.  Post intervention, there is a 0% residual stenosis.  Prox RCA to Mid RCA lesion is 40% stenosed.  Post intervention, there is a 0% residual stenosis.  Multivessel CAD with irregularity of the mid LAD with narrowings of 30 to 50%, mild 20% narrowing in the proximal circumflex with 40% narrowing in the OM vessel; and large dominant RCA with subtotal thrombotic 99% proximal stenosis in the region of mild to moderate plaque above and below the stenosis and 40% smooth mid PLA  stenosis  LVEDP 17 mm Hg.  Successful percutaneous coronary intervention with PTCA and DES stenting with a 3.5 x 34 mm Resolute Onyx stent postdilated to 3.80 milliliters with a 99% stenosis moderate narrowings involving below the lesion being reduced to 0%. There is brisk TIMI-3 flow with no evidence for dissection.  Transient AV block when crossing the aortic valve which responded to atropine.  RECOMMENDATION: DAPT for at least 1 year. Aggressive lipid-lowering therapy with target LDL less than 70. Optimal blood pressure control with goal less than 130/80 and ideal less than 120/80.  Coronary Diagrams  Diagnostic Dominance: Right  Intervention      Recent Labs: 12/21/2019: ALT 27 02/06/2020: BUN 17; Creatinine, Ser 1.29; Hemoglobin 15.2; NT-Pro BNP 77; Platelets 320; Potassium 4.1; Sodium 135  Recent Lipid Panel    Component Value Date/Time   CHOL 144 01/21/2020 0308   TRIG 115 01/21/2020 0308   HDL  28 (L) 01/21/2020 0308   CHOLHDL 5.1 01/21/2020 0308   VLDL 23 01/21/2020 0308   LDLCALC 93 01/21/2020 0308      Physical Exam:    VS:  BP 132/72   Pulse 71   Ht 5' 10.5" (1.791 m)   Wt 199 lb 3.2 oz (90.4 kg)   SpO2 97%   BMI 28.18 kg/m     Wt Readings from Last 3 Encounters:  02/23/20 199 lb 3.2 oz (90.4 kg)  02/06/20 196 lb (88.9 kg)  01/23/20 194 lb 6.4 oz (88.2 kg)     GEN:  Well nourished, well developed in no acute distress HEENT: Normal NECK: No JVD; No carotid bruits CARDIAC: RRR, no murmurs, rubs, gallops RESPIRATORY:  Clear to auscultation without rales, wheezing or rhonchi  ABDOMEN: Soft, non-tender, non-distended MUSCULOSKELETAL:  No edema; No deformity  SKIN: Warm and dry NEUROLOGIC:  Alert and oriented x 3 PSYCHIATRIC:  Normal affect   ASSESSMENT:    1. NSTEMI (non-ST elevated myocardial infarction) (HCC)   2. Mixed hyperlipidemia   3. Hypertension, unspecified type   4. 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 S/p PCI to RCA. Doing well other than SOB with ticagrelor. Very active walking 5-22miles per day. Compliant with all medications. - Echo showed LVEF 60-65%, no WMA, G1DD, trivial MR - DAPT with Aspirin and Brilinta for 1 year - If SOB persists, can transition to plavix instead of ticagrelor - Continue Atorvastatin 80mg  daily; re-check lipids in 1 month with goal LDL<70 - Continue metoprolol 25mg  XL - Continue losartan 50mg  daily - Cardiac rehab   #HTN - Continue  Lopressor 25mg  XL - Continue losartan 50mg  daily - Continue amlodipine 2.5mg  daily - Monitor blood pressures at home with goal 120/80s or below  #HLD - Atorvastatin 80 mg daily - Repeat labs in 1 month - Goal LDL <70; if not at goal will add zetia  #DM2 -Management per PCP   Medication Adjustments/Labs and Tests Ordered: Current medicines are reviewed at length with the patient today.  Concerns regarding medicines are outlined above.  Orders Placed This Encounter  Procedures  . Lipid panel   No orders of the defined types were placed in this encounter.   Patient Instructions  Medication Instructions:  Your physician recommends that you continue on your current medications as directed. Please refer to the Current Medication list given to you today.   Labwork: Your physician recommends that you return for lab work in: 4 weeks for a Fasting lipid panal   Testing/Procedures: None ordered.  Follow-Up:  Jan 18 for fasting lipids. Please do not eat any breakfast before lab draw. You may have water, unsweetened black coffee and tea.  Feb 11 @ 4pm follow up with Dr. Shari Prows  Any Other Special Instructions Will Be Listed Below (If Applicable).     If you need a refill on your cardiac medications before your next appointment, please call your pharmacy.      Signed, Meriam Sprague, MD  02/23/2020 5:30 PM    [facility]

## 2020-02-23 ENCOUNTER — Encounter: Payer: Self-pay | Admitting: Cardiology

## 2020-02-23 ENCOUNTER — Ambulatory Visit (INDEPENDENT_AMBULATORY_CARE_PROVIDER_SITE_OTHER): Payer: 59 | Admitting: Cardiology

## 2020-02-23 ENCOUNTER — Other Ambulatory Visit: Payer: Self-pay

## 2020-02-23 VITALS — BP 132/72 | HR 71 | Ht 70.5 in | Wt 199.2 lb

## 2020-02-23 DIAGNOSIS — E782 Mixed hyperlipidemia: Secondary | ICD-10-CM | POA: Diagnosis not present

## 2020-02-23 DIAGNOSIS — I2510 Atherosclerotic heart disease of native coronary artery without angina pectoris: Secondary | ICD-10-CM | POA: Diagnosis not present

## 2020-02-23 DIAGNOSIS — I214 Non-ST elevation (NSTEMI) myocardial infarction: Secondary | ICD-10-CM

## 2020-02-23 DIAGNOSIS — I10 Essential (primary) hypertension: Secondary | ICD-10-CM

## 2020-02-23 NOTE — Patient Instructions (Addendum)
Medication Instructions:  Your physician recommends that you continue on your current medications as directed. Please refer to the Current Medication list given to you today.   Labwork: Your physician recommends that you return for lab work in: 4 weeks for a Fasting lipid panal   Testing/Procedures: None ordered.  Follow-Up:  Jan 18 for fasting lipids. Please do not eat any breakfast before lab draw. You may have water, unsweetened black coffee and tea.  Feb 11 @ 4pm follow up with Dr. Shari Prows  Any Other Special Instructions Will Be Listed Below (If Applicable).     If you need a refill on your cardiac medications before your next appointment, please call your pharmacy.

## 2020-02-29 ENCOUNTER — Telehealth: Payer: Self-pay | Admitting: Cardiology

## 2020-02-29 NOTE — Telephone Encounter (Signed)
He wanted to try to stay on ticagrelor despite having shortness of breath. The only thing that would drive [place] to change would be if he can no longer tolerate the symptoms. Otherwise, we will stay on it.

## 2020-02-29 NOTE — Telephone Encounter (Signed)
Forwarding to MD for recommendation.

## 2020-02-29 NOTE — Telephone Encounter (Signed)
    Pt c/o medication issue:  1. Name of Medication: ticagrelor (BRILINTA) 90 MG TABS tablet  2. How are you currently taking this medication (dosage and times per day)? Take 1 tablet (90 mg total) by mouth 2 (two) times daily.  3. Are you having a reaction (difficulty breathing--STAT)?   4. What is your medication issue? Pt said he really can't take brilinta it's making him get SOB, he wanted to ask Dr. Shari Prows for recommendations

## 2020-02-29 NOTE — Telephone Encounter (Signed)
I spoke with patient and gave him information from Dr Shari Prows.  He reports when he was in the office he told Dr Shari Prows he would like to try and stay on Brilinta.  Since then he has continued to have shortness of breath.  Happens all the time. He reports shortness of breath is not getting better.  He does not feel like he can stay on Brilinta until next office visit in February and is asking if he could be changed to another medication.

## 2020-03-03 ENCOUNTER — Other Ambulatory Visit: Payer: Self-pay | Admitting: Cardiology

## 2020-03-03 DIAGNOSIS — I2510 Atherosclerotic heart disease of native coronary artery without angina pectoris: Secondary | ICD-10-CM

## 2020-03-03 MED ORDER — CLOPIDOGREL BISULFATE 75 MG PO TABS: 75.0000 mg | ORAL_TABLET | Freq: Every day | ORAL | 3 refills | Status: DC

## 2020-03-04 NOTE — Telephone Encounter (Signed)
Meriam Sprague, MD  P [facility] Triage I sent plavix to his pharmacy as he has not been tolerating ticagrelor due to shortness of breath. Will you let him know it's there and he can change meds whenever he wants. Once he starts the plavix, he stops the ticagrelor. Continue the aspirin daily.    Left message for patient with this information. I advised him to stop Brilinta and start Plavix 75 mg once daily and to continue aspirin 81 mg daily. I advised him to call the office with any questions.

## 2020-03-11 ENCOUNTER — Other Ambulatory Visit: Payer: 59

## 2020-03-26 ENCOUNTER — Other Ambulatory Visit: Payer: 59

## 2020-03-29 ENCOUNTER — Other Ambulatory Visit: Payer: 59 | Admitting: *Deleted

## 2020-03-29 ENCOUNTER — Other Ambulatory Visit: Payer: Self-pay

## 2020-03-29 ENCOUNTER — Encounter (INDEPENDENT_AMBULATORY_CARE_PROVIDER_SITE_OTHER): Payer: Self-pay

## 2020-03-29 DIAGNOSIS — E782 Mixed hyperlipidemia: Secondary | ICD-10-CM

## 2020-04-01 LAB — LIPID PANEL

## 2020-04-02 ENCOUNTER — Ambulatory Visit: Payer: 59 | Admitting: Cardiology

## 2020-04-16 NOTE — Progress Notes (Signed)
Cardiology Office Note:    Date:  04/19/2020   ID:  Guy [name], DOB 1965/12/18, MRN [id number]  PCP:  Guy Presto, MD  [facility] Cardiologist:  Meriam Sprague, MD  [facility] Electrophysiologist:  None   Referring MD: Guy Presto, MD     History of Present Illness:    Guy [name] is a 55 y.o. [name] with a hx of [name], Guy [name], Guy [name].  Patient was admitted to [facility] hospital in 01/2020 with episode of chest pain/indigestion. Trop elevated at 774 consistent with NSTEMI. Underwent LHC which showed multivessel CAD with culprit lesion being the RCA with 99% stenosis. He was treated with DES to RCA. LVEDP noted to be . He had transient AV block when crossing the aortic valve which responded to atropine. Post procedure recommendations for DAPT with Aspirin and Brilinta for at least 1 year. Echo showed LVEF 60-65%, no WMA, G1DD, trivial MR.   During last visit on 12/17, the patient continued to have shortness of breath which was thought to be due to ticagrelor. He was ultimately changed to plavix given continued persistent symptoms. Today, the patient states that he is doing great except he is having frequent nosebleeds about every 3 days. SOB of breath resolved. Otherwise, he is active and walking 5 miles per day without issues. Blood pressure are better controlled with SBPs 120s.   Past Medical History:  Diagnosis Date  . Collapsed lung   . DDD (degenerative disc disease), cervical   . Diabetes mellitus without complication (HCC)   . Enlarged prostate   . [name] (hypertension)     Past Surgical History:  Procedure Laterality Date  . APPENDECTOMY    . CORONARY STENT INTERVENTION N/A 01/22/2020   Procedure: CORONARY STENT INTERVENTION;  Surgeon: Lennette Bihari, MD;  [facility];  Service: Cardiovascular;  Laterality: N/A;  . HERNIA REPAIR    . LEFT HEART CATH AND CORONARY  ANGIOGRAPHY N/A 01/22/2020   Procedure: LEFT HEART CATH AND CORONARY ANGIOGRAPHY;  Surgeon: Lennette Bihari, MD;  Location: [facility] CV LAB;  Service: Cardiovascular;  Laterality: N/A;    Current Medications: Current Meds  Medication Sig  . amLODipine (NORVASC) 2.5 MG tablet Take 1 tablet (2.5 mg total) by mouth daily.  Marland Kitchen aspirin EC 81 MG EC tablet Take 1 tablet (81 mg total) by mouth daily. Swallow whole.  Marland Kitchen atorvastatin (LIPITOR) 80 MG tablet Take 1 tablet (80 mg total) by mouth daily.  . clopidogrel (PLAVIX) 75 MG tablet Take 1 tablet (75 mg total) by mouth daily.  Marland Kitchen losartan (COZAAR) 50 MG tablet Take 1 tablet (50 mg total) by mouth daily.  . metoprolol succinate (TOPROL-XL) 25 MG 24 hr tablet Take 1 tablet (25 mg total) by mouth daily.  . nitroGLYCERIN (NITROSTAT) 0.4 MG SL tablet Place 1 tablet (0.4 mg total) under the tongue every 5 (five) minutes x 3 doses as needed for chest pain.  . [DISCONTINUED] Aspirin-Acetaminophen-Caffeine (GOODY HEADACHE PO) Take 1 packet by mouth as needed (headache/pain).  . [DISCONTINUED] pantoprazole (PROTONIX) 40 MG tablet Take 1 tablet (40 mg total) by mouth daily.     Allergies:   Penicillins and Sulfa antibiotics   Social History   Socioeconomic History  . Marital status: Divorced    Spouse name: Not on file  . Number of children: Not on file  . Years of education: Not on file  .  Highest education level: Not on file  Occupational History  . Not on file  Tobacco Use  . Smoking status: Former Games developer  . Smokeless tobacco: Never Used  Substance and Sexual Activity  . Alcohol use: No  . Drug use: No  . Sexual activity: Yes  Other Topics Concern  . Not on file  Social History Narrative   Lives in [place].  Works at [employer]: Not on [employer] Insecurity: Not on file  Transportation Needs: Not on file  Physical Activity: Not on file  Stress: Not on file  Social  Connections: Not on file     Family History: The patient's family history includes CAD in his maternal grandfather and maternal grandmother; Diabetes in an other family member.  ROS:   Please see the history of present illness.    Review of Systems  Constitutional: Negative for chills, fever and weight loss.  HENT: Positive for nosebleeds.   Eyes: Negative for blurred vision and redness.  Respiratory: Negative for shortness of breath.   Cardiovascular: Negative for chest pain, palpitations, orthopnea, claudication, leg swelling and PND.  Gastrointestinal: Negative for melena, nausea and vomiting.  Genitourinary: Negative for hematuria.  Musculoskeletal: Negative for falls.  Neurological: Negative for dizziness and loss of consciousness.  Endo/Heme/Allergies: Negative for polydipsia.  Psychiatric/Behavioral: Negative for substance abuse.    EKGs/Labs/Other Studies Reviewed:    The following studies were reviewed today: Echo 01/21/20 1. Left ventricular ejection fraction, by estimation, is 60 to 65%. The  left ventricle has normal function. The left ventricle has no regional  wall motion abnormalities. Left ventricular diastolic parameters are  consistent with Grade I diastolic  dysfunction (impaired relaxation).  2. Right ventricular systolic function is normal. The right ventricular  size is normal.  3. The mitral valve is normal in structure. Trivial mitral valve  regurgitation. No evidence of mitral stenosis.  4. The aortic valve is tricuspid. Aortic valve regurgitation is not  visualized. No aortic stenosis is present.  5. The inferior vena cava is normal in size with greater than 50%  respiratory variability, suggesting right atrial pressure of 3 mmHg.   Cardiac cath 01/22/20  Mid LAD to Dist LAD lesion is 50% stenosed.  Mid LAD-1 lesion is 30% stenosed.  Mid LAD-2 lesion is 50% stenosed.  2nd Mrg lesion is 40% stenosed.  Prox Cx to Mid Cx lesion is 20%  stenosed.  A stent was successfully placed.  Prox RCA lesion is 99% stenosed.  Post intervention, there is a 0% residual stenosis.  Prox RCA to Mid RCA lesion is 40% stenosed.  Post intervention, there is a 0% residual stenosis.  Multivessel CAD with irregularity of the mid LAD with narrowings of 30 to 50%, mild 20% narrowing in the proximal circumflex with 40% narrowing in the OM vessel; and large dominant RCA with subtotal thrombotic 99% proximal stenosis in the region of mild to moderate plaque above and below the stenosis and 40% smooth mid PLA stenosis  LVEDP 17 mm Hg.  Successful percutaneous coronary intervention with PTCA and DES stenting with a 3.5 x 34 mm Resolute Onyx stent postdilated to 3.80 milliliters with a 99% stenosis moderate narrowings involving below the lesion being reduced to 0%. There is brisk TIMI-3 flow with no evidence for dissection.  Transient AV block when crossing the aortic valve which responded to atropine.  RECOMMENDATION: DAPT for at least 1 year. Aggressive lipid-lowering therapy with  target LDL less than 70. Optimal blood pressure control with goal less than 130/80 and ideal less than 120/80.  Coronary Diagrams  Diagnostic Dominance: Right  Intervention      Recent Labs: 12/21/2019: ALT 27 02/06/2020: BUN 17; Creatinine, Ser 1.29; Hemoglobin 15.2; NT-Pro BNP 77; Platelets 320; Potassium 4.1; Sodium 135  Recent Lipid Panel    Component Value Date/Time   CHOL CANCELED 03/29/2020 0721   TRIG CANCELED 03/29/2020 0721   HDL CANCELED 03/29/2020 0721   CHOLHDL 5.1 01/21/2020 0308   VLDL 23 01/21/2020 0308   LDLCALC 93 01/21/2020 0308      Physical Exam:    VS:  BP 116/64   Pulse 60   Ht 5\' 10"  (1.778 m)   Wt 199 lb (90.3 kg)   SpO2 97%   BMI 28.55 kg/m     Wt Readings from Last 3 Encounters:  04/19/20 199 lb (90.3 kg)  02/23/20 199 lb 3.2 oz (90.4 kg)  02/06/20 196 lb (88.9 kg)     GEN:  Well nourished, well  developed in no acute distress HEENT: Normal NECK: No JVD; No carotid bruits CARDIAC: RRR, no murmurs, rubs, gallops RESPIRATORY:  Clear to auscultation without rales, wheezing or rhonchi  ABDOMEN: Soft, non-tender, non-distended MUSCULOSKELETAL:  No edema; No deformity  SKIN: Warm and dry NEUROLOGIC:  Alert and oriented x 3 PSYCHIATRIC:  Normal affect   ASSESSMENT:    1. Coronary artery disease involving native heart, unspecified vessel or lesion type, unspecified whether angina present   2. Mixed hyperlipidemia   3. NSTEMI (non-ST elevated myocardial infarction) (HCC)   4. Hypertension, unspecified type   5. 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 S/p PCI to RCA. Doing well other than SOB with ticagrelor. Very active walking 5-59miles per day. Compliant with all medications. - Echo showed LVEF 60-65%, no WMA, G1DD, trivial MR - DAPT with Aspirin and Plavix for 1 year - Stopped brilinta due to SOB - Continue Atorvastatin 80mg  daily; re-check lipids in 1 month with goal LDL<70 - Continue metoprolol 25mg  XL - Continue losartan 50mg  daily - Cardiac rehab   #[name] - Continue Lopressor 25mg  XL - Continue losartan 50mg  daily - Continue amlodipine 2.5mg  daily - Monitor blood pressures at home with goal 120/80s or below  #Guy [name] - Atorvastatin 80 mg daily - Repeat labs with PCP  - Goal LDL <70; if not at goal will add zetia  #DM2 -Management per PCP  #Nosebleeds: -Declined ENT referral at this time -Continue  humidifier    Medication Adjustments/Labs and Tests Ordered: Current medicines are reviewed at length with the patient today.  Concerns regarding medicines are outlined above.  No orders of the defined types were placed in this encounter.  No orders of the defined types were placed in this encounter.   Patient Instructions  Medication Instructions:  The current medical regimen is effective;  continue present plan and medications.  *If you need a refill on your cardiac medications before your next appointment, please call  your pharmacy*  [name]: At [facility], you and your health needs are our priority.  As part of our continuing mission to provide you with exceptional heart care, we have created designated Provider Care Teams.  These Care Teams include your primary Cardiologist (physician) and Advanced Practice Providers (APPs -  Physician Assistants and Nurse Practitioners) who all work together to provide you with the care you need, when you need it.  We recommend signing up for the patient portal called "MyChart".  Sign up information is provided on this After Visit Summary.  MyChart is used to connect with patients for Virtual Visits (Telemedicine).  Patients are able to view lab/test results, encounter notes, upcoming appointments, etc.  Non-urgent messages can be sent to your provider as well.   To learn more about what you can do with MyChart, go to [email or web address].    Your next appointment:   6 month(s)  The format for your next appointment:   In Person  Provider:   Laurance Flatten, MD   Thank you for choosing [facility]!!        Signed, Meriam Sprague, MD  04/19/2020 4:33 PM    [facility]

## 2020-04-19 ENCOUNTER — Encounter: Payer: Self-pay | Admitting: Cardiology

## 2020-04-19 ENCOUNTER — Ambulatory Visit (INDEPENDENT_AMBULATORY_CARE_PROVIDER_SITE_OTHER): Payer: 59 | Admitting: Cardiology

## 2020-04-19 ENCOUNTER — Other Ambulatory Visit: Payer: Self-pay

## 2020-04-19 VITALS — BP 116/64 | HR 60 | Ht 70.0 in | Wt 199.0 lb

## 2020-04-19 DIAGNOSIS — E119 Type 2 diabetes mellitus without complications: Secondary | ICD-10-CM

## 2020-04-19 DIAGNOSIS — I10 Essential (primary) hypertension: Secondary | ICD-10-CM

## 2020-04-19 DIAGNOSIS — E782 Mixed hyperlipidemia: Secondary | ICD-10-CM

## 2020-04-19 DIAGNOSIS — I214 Non-ST elevation (NSTEMI) myocardial infarction: Secondary | ICD-10-CM

## 2020-04-19 DIAGNOSIS — I2510 Atherosclerotic heart disease of native coronary artery without angina pectoris: Secondary | ICD-10-CM

## 2020-04-19 NOTE — Patient Instructions (Signed)
Medication Instructions:  The current medical regimen is effective;  continue present plan and medications.  *If you need a refill on your cardiac medications before your next appointment, please call your pharmacy*  Follow-Up: At [facility], you and your health needs are our priority.  As part of our continuing mission to provide you with exceptional heart care, we have created designated Provider Care Teams.  These Care Teams include your primary Cardiologist (physician) and Advanced Practice Providers (APPs -  Physician Assistants and Nurse Practitioners) who all work together to provide you with the care you need, when you need it.  We recommend signing up for the patient portal called "MyChart".  Sign up information is provided on this After Visit Summary.  MyChart is used to connect with patients for Virtual Visits (Telemedicine).  Patients are able to view lab/test results, encounter notes, upcoming appointments, etc.  Non-urgent messages can be sent to your provider as well.   To learn more about what you can do with MyChart, go to [email or web address].    Your next appointment:   6 month(s)  The format for your next appointment:   In Person  Provider:   Laurance Flatten, MD   Thank you for choosing [facility]!!

## 2020-05-14 ENCOUNTER — Telehealth: Payer: Self-pay

## 2020-05-14 NOTE — Telephone Encounter (Signed)
[facility] pharmacy is stating that pt's medication Losartan 50 my tablets are unavailable/ on backorder with no release date. Would Dr. Shari Prows like to prescribe alternate medication? Please address

## 2020-05-14 NOTE — Telephone Encounter (Addendum)
Left message to call back  Called pt to ask if ok to send refills to [facility] in [facility] (they confirmed they have dose in stock)  (Dr. Shari Prows ok to switch pt to Valsartan 80 mg if needed)

## 2020-05-16 NOTE — Telephone Encounter (Signed)
Pt called in and stated that he is ok with the med being sent to [facility] in [place].  He stated there is something wrong with his phone and can not rec calls

## 2020-05-16 NOTE — Telephone Encounter (Signed)
Please address

## 2020-05-17 MED ORDER — LOSARTAN POTASSIUM 50 MG PO TABS: 50.0000 mg | ORAL_TABLET | Freq: Every day | ORAL | 3 refills | Status: DC

## 2020-05-17 NOTE — Telephone Encounter (Signed)
Losartan Rx sent to [facility]

## 2020-11-04 ENCOUNTER — Encounter: Payer: Self-pay | Admitting: Physician Assistant

## 2020-11-04 NOTE — Progress Notes (Addendum)
Cardiology Office Note    Date:  11/05/2020   ID:  HOLBERT CAPLES, DOB Jun 29, 1965, MRN [id number]  PCP:  Vivien Presto, MD  Cardiologist:  Meriam Sprague, MD  Electrophysiologist:  None   Chief Complaint: f/u CAD  History of Present Illness:   Guy [name] is a 55 y.o. male with history of CAD with NSTEMI 01/2020 s/p DES to RCA, remote diagnosis of DM (drastically changed diet with resolution in hyperglycemia), HTN, HLD, enlarged prostate, possible CKD II by labs who presents for follow-up. He was previously admitted 01/2020 with NSTEMI - Cath  showed multivessel CAD as outlined below ultimately with PTCA/DES of RCA. He had transient AV block when crossing the aortic valve which responded to atropine. 2D echo that time showed EF 60-65%, grade 1 DD. Brilinta was previously changed to Plavix due to SOB in follow-up.  He returns back for follow-up overall clinically stable from cardiac standpoint. He does report a generalized lack of energy dating back specifically to the week he had his MI. No recurrent CP or dyspnea reported with his usual activities (walks regularly, including several miles per week), just generalized fatigue. No syncope or bleeding reported. 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   Labwork independently reviewed: 01/2020 WBC 13.5, Hgb 15.2, Plt 320, K 4.1, Cr 1.29, LDL 93, trig 115 2017 TSH wnl  Past Medical History:  Diagnosis Date   CAD (coronary artery disease)    CKD (chronic kidney disease), stage II    Collapsed lung    DDD (degenerative disc disease), cervical    Diabetes mellitus without complication (HCC)    Enlarged prostate    Essential hypertension    HTN (hypertension)    Hyperlipidemia LDL goal <70     Past Surgical History:  Procedure Laterality Date   APPENDECTOMY     CORONARY STENT INTERVENTION N/A 01/22/2020   Procedure: CORONARY STENT INTERVENTION;  Surgeon: Lennette Bihari, MD;  [facility]  INVASIVE CV LAB;  Service: Cardiovascular;  Laterality: N/A;   HERNIA REPAIR     LEFT HEART CATH AND CORONARY ANGIOGRAPHY N/A 01/22/2020   Procedure: LEFT HEART CATH AND CORONARY ANGIOGRAPHY;  Surgeon: Lennette Bihari, MD;  Location: [facility] INVASIVE CV LAB;  Service: Cardiovascular;  Laterality: N/A;    Current Medications: Current Meds  Medication Sig   amLODipine (NORVASC) 2.5 MG tablet Take 1 tablet (2.5 mg total) by mouth daily.   aspirin 81 MG EC tablet TAKE 1 TABLET (81 MG TOTAL) BY MOUTH DAILY. SWALLOW WHOLE.   atorvastatin (LIPITOR) 80 MG tablet Take 1 tablet (80 mg total) by mouth daily.   clopidogrel (PLAVIX) 75 MG tablet Take 1 tablet (75 mg total) by mouth daily.   losartan (COZAAR) 50 MG tablet Take 1 tablet (50 mg total) by mouth daily.   metoprolol succinate (TOPROL-XL) 25 MG 24 hr tablet Take 1 tablet (25 mg total) by mouth daily.    nitroGLYCERIN (NITROSTAT) 0.4 MG SL tablet PLACE 1 TABLET (0.4 MG TOTAL) UNDER THE TONGUE EVERY FIVE MINUTES X 3 DOSES AS NEEDED FOR CHEST PAIN.    Allergies:   Penicillins and Sulfa antibiotics   Social History   Socioeconomic History   Marital status: Divorced    Spouse name: Not on file   Number of children: Not on file   Years of education: Not on file   Highest education level: Not on file  Occupational History   Not on file  Tobacco Use  Smoking status: Former   Smokeless tobacco: Never  Substance and Sexual Activity   Alcohol use: No   Drug use: No   Sexual activity: Yes  Other Topics Concern   Not on file  Social History Narrative   Lives in [place].  Works at [employer]: Not on [employer] Insecurity: Not on file  Transportation Needs: Not on file  Physical Activity: Not on file  Stress: Not on file  Social Connections: Not on file     Family History:  The patient's family history includes CAD in his maternal grandfather and maternal grandmother;  Diabetes in an other family member.  ROS:   Please see the history of present illness.  All other systems are reviewed and otherwise negative.    EKGs/Labs/Other Studies Reviewed:    Studies reviewed are outlined and summarized above. Reports included below if pertinent.  2D echo 01/2020   1. Left ventricular ejection fraction, by estimation, is 60 to 65%. The  left ventricle has normal function. The left ventricle has no regional  wall motion abnormalities. Left ventricular diastolic parameters are  consistent with Grade I diastolic  dysfunction (impaired relaxation).   2. Right ventricular systolic function is normal. The right ventricular  size is normal.   3. The mitral valve is normal in structure. Trivial mitral valve  regurgitation. No evidence of mitral stenosis.   4. The aortic valve is tricuspid. Aortic valve regurgitation is not  visualized. No aortic stenosis is present.   5. The inferior vena cava is normal in size with greater than 50%  respiratory variability, suggesting right atrial pressure of 3 mmHg.   Cath 01/2020 Conclusion    Mid LAD to Dist LAD lesion is 50% stenosed. Mid LAD-1 lesion is 30% stenosed. Mid LAD-2 lesion is 50% stenosed. 2nd Mrg lesion is 40% stenosed. Prox Cx to Mid Cx lesion is 20% stenosed. A stent was successfully placed. Prox RCA lesion is 99% stenosed. Post intervention, there is a 0% residual stenosis. Prox RCA to Mid RCA lesion is 40% stenosed. Post intervention, there is a 0% residual stenosis.   Multivessel CAD with irregularity of the mid LAD with narrowings of 30 to 50%, mild 20% narrowing in the proximal circumflex with 40% narrowing in the OM vessel; and large dominant RCA with subtotal thrombotic 99% proximal stenosis in the region of mild to moderate plaque above and below the stenosis and 40% smooth mid PLA stenosis   LVEDP 17 mm Hg.   Successful percutaneous coronary intervention with PTCA and DES stenting with a 3.5  x 34 mm Resolute Onyx stent postdilated to 3.80 milliliters with a 99% stenosis moderate narrowings involving below the lesion being reduced to 0%.  There is brisk TIMI-3 flow with no evidence for dissection.   Transient AV block when crossing the aortic valve which responded to atropine.   RECOMMENDATION: DAPT for at least 1 year.  Aggressive lipid-lowering therapy with target LDL less than 70.  Optimal blood pressure control with goal less than 130/80 and ideal less than 120/80.      EKG:  EKG is ordered today, personally reviewed, demonstrating NSR 73bpm, nonspecific STTW changes inferiorly, stable from prior  Recent Labs: 12/21/2019: ALT 27 02/06/2020: BUN 17; Creatinine, Ser 1.29; Hemoglobin 15.2; NT-Pro BNP 77; Platelets 320; Potassium 4.1; Sodium 135  Recent Lipid Panel    Component Value Date/Time   CHOL CANCELED 03/29/2020 0721   TRIG  CANCELED 03/29/2020 0721   HDL CANCELED 03/29/2020 0721   CHOLHDL 5.1 01/21/2020 0308   VLDL 23 01/21/2020 0308   LDLCALC 93 01/21/2020 0308    PHYSICAL EXAM:    VS:  BP 120/74   Pulse 73   Ht 5\' 10"  (1.778 m)   Wt 200 lb 12.8 oz (91.1 kg)   SpO2 98%   BMI 28.81 kg/m   BMI: Body mass index is 28.81 kg/m.  GEN: Well nourished, well developed male in no acute distress HEENT: normocephalic, atraumatic Neck: no JVD, carotid bruits, or masses Cardiac: RRR; no murmurs, rubs, or gallops, no edema  Respiratory:  clear to auscultation bilaterally, normal work of breathing GI: soft, nontender, nondistended, + BS MS: no deformity or atrophy Skin: warm and dry, no rash Neuro:  Alert and Oriented x 3, Strength and sensation are intact, follows commands Psych: euthymic mood, full affect  Wt Readings from Last 3 Encounters:  11/05/20 200 lb 12.8 oz (91.1 kg)  04/19/20 199 lb (90.3 kg)  02/23/20 199 lb 3.2 oz (90.4 kg)     ASSESSMENT & PLAN:   1. CAD - clinically stable without recurrent angina or dyspnea, but continues to have a sense of  generalized fatigue ever since his MI. Unclear if this is physiologically mediated or perhaps a medication side effect. We will decrease his metoprolol to 1/2 tablet and move to bedtime. I asked him to provide an update to 02/25/20 in 2 weeks to see how he is feeling. We could consider stopping this altogether, but would like to trial the lower dose first. Will also obtain updated labs today including CBC, CMET, baseline thyroid. I will also reach out to the provider covering Dr. [place] patients to inquire about whether we can stop his Plavix at the 1 year mark post-PCI. The patient is eager to decrease as many medications as possible. Addendum: Per Dr. Devin Going, "Had proximal RVA disease as NSTEMI presentation and has residual disease in other vessels.  Would recommend DAPT until 01/2021, then stop plavix due to patients wishes." Will relay to pt in result note with labs to take last dose of Plavix 01/20/21, but continue ASA.  2. Essential HTN - BP well controlled. Discussed goal SBP of <130 systolic.  3. Hyperlipidemia - recheck CMET/lipid profile today (had small breakfast this AM but no large/sugary meal). Continue atorvastatin.  4. CKD stage II by labs - prior Cr noted in the 1.2 range - will recheck today. Discussed finding with pt today.   5. Leukocytosis - noted by labs at time of MI, will recheck today.  6. Generalized fatigue - see workup per #1. If fatigue persists beyond medication adjustment we discussed f/u with primary care for other causes (follows with [facility]).  Disposition: F/u with Dr. 01/22/21 in 6 months.   Medication Adjustments/Labs and Tests Ordered: Current medicines are reviewed at length with the patient today.  Concerns regarding medicines are outlined above. Medication changes, Labs and Tests ordered today are summarized above and listed in the Patient Instructions accessible in Encounters.   Signed, Shari Prows, PA-C  11/05/2020 9:40 AM    [facility] [address] [place], [place], [place]  [place] Phone: [phone]; Fax: [phone]

## 2020-11-05 ENCOUNTER — Ambulatory Visit (INDEPENDENT_AMBULATORY_CARE_PROVIDER_SITE_OTHER): Payer: 59 | Admitting: Physician Assistant

## 2020-11-05 ENCOUNTER — Other Ambulatory Visit: Payer: Self-pay

## 2020-11-05 ENCOUNTER — Encounter: Payer: Self-pay | Admitting: Physician Assistant

## 2020-11-05 VITALS — BP 120/74 | HR 73 | Ht 70.0 in | Wt 200.8 lb

## 2020-11-05 DIAGNOSIS — E785 Hyperlipidemia, unspecified: Secondary | ICD-10-CM

## 2020-11-05 DIAGNOSIS — I2510 Atherosclerotic heart disease of native coronary artery without angina pectoris: Secondary | ICD-10-CM

## 2020-11-05 DIAGNOSIS — I10 Essential (primary) hypertension: Secondary | ICD-10-CM

## 2020-11-05 DIAGNOSIS — D72829 Elevated white blood cell count, unspecified: Secondary | ICD-10-CM

## 2020-11-05 DIAGNOSIS — R5383 Other fatigue: Secondary | ICD-10-CM

## 2020-11-05 DIAGNOSIS — N182 Chronic kidney disease, stage 2 (mild): Secondary | ICD-10-CM | POA: Diagnosis not present

## 2020-11-05 LAB — COMPREHENSIVE METABOLIC PANEL
ALT: 25 IU/L (ref 0–44)
AST: 27 IU/L (ref 0–40)
Albumin/Globulin Ratio: 1.8 (ref 1.2–2.2)
Albumin: 4.3 g/dL (ref 3.8–4.9)
Alkaline Phosphatase: 138 IU/L — ABNORMAL HIGH (ref 44–121)
BUN/Creatinine Ratio: 14 (ref 9–20)
BUN: 17 mg/dL (ref 6–24)
Bilirubin Total: 0.8 mg/dL (ref 0.0–1.2)
CO2: 24 mmol/L (ref 20–29)
Calcium: 9.3 mg/dL (ref 8.7–10.2)
Chloride: 100 mmol/L (ref 96–106)
Creatinine, Ser: 1.18 mg/dL (ref 0.76–1.27)
Globulin, Total: 2.4 g/dL (ref 1.5–4.5)
Glucose: 96 mg/dL (ref 65–99)
Potassium: 3.8 mmol/L (ref 3.5–5.2)
Sodium: 137 mmol/L (ref 134–144)
Total Protein: 6.7 g/dL (ref 6.0–8.5)
eGFR: 73 mL/min/{1.73_m2} (ref >59)

## 2020-11-05 LAB — CBC
Hematocrit: 39.6 % (ref 37.5–51.0)
Hemoglobin: 14 g/dL (ref 13.0–17.7)
MCH: 30.9 pg (ref 26.6–33.0)
MCHC: 35.4 g/dL (ref 31.5–35.7)
MCV: 87 fL (ref 79–97)
Platelets: 231 10*3/uL (ref 150–450)
RBC: 4.53 x10E6/uL (ref 4.14–5.80)
RDW: 12.2 % (ref 11.6–15.4)
WBC: 9 10*3/uL (ref 3.4–10.8)

## 2020-11-05 LAB — LIPID PANEL
Chol/HDL Ratio: 3.6 ratio (ref 0.0–5.0)
Cholesterol, Total: 105 mg/dL (ref 100–199)
HDL: 29 mg/dL — ABNORMAL LOW (ref >39)
LDL Chol Calc (NIH): 55 mg/dL (ref 0–99)
Triglycerides: 110 mg/dL (ref 0–149)
VLDL Cholesterol Cal: 21 mg/dL (ref 5–40)

## 2020-11-05 LAB — TSH: TSH: 2.19 u[IU]/mL (ref 0.450–4.500)

## 2020-11-05 MED ORDER — METOPROLOL SUCCINATE ER 50 MG PO TB24: 25.0000 mg | ORAL_TABLET | Freq: Every day | ORAL | 3 refills | Status: DC

## 2020-11-05 MED ORDER — METOPROLOL SUCCINATE ER 25 MG PO TB24: 12.5000 mg | ORAL_TABLET | Freq: Every day | ORAL | 3 refills | Status: DC

## 2020-11-05 NOTE — Patient Instructions (Addendum)
Medication Instructions:  Your physician has recommended you make the following change in your medication:   DECREASE the Metoprolol to 1/2 tablet and start taking it at night time    *If you need a refill on your cardiac medications before your next appointment, please call your pharmacy*   Lab Work: TODAY:  CMET, CBC, LIPID, & TSH  If you have labs (blood work) drawn today and your tests are completely normal, you will receive your results only by: MyChart Message (if you have MyChart) OR A paper copy in the mail If you have any lab test that is abnormal or we need to change your treatment, we will call you to review the results.   Testing/Procedures: None ordered   Follow-Up: At [facility], you and your health needs are our priority.  As part of our continuing mission to provide you with exceptional heart care, we have created designated Provider Care Teams.  These Care Teams include your primary Cardiologist (physician) and Advanced Practice Providers (APPs -  Physician Assistants and Nurse Practitioners) who all work together to provide you with the care you need, when you need it.  We recommend signing up for the patient portal called "MyChart".  Sign up information is provided on this After Visit Summary.  MyChart is used to connect with patients for Virtual Visits (Telemedicine).  Patients are able to view lab/test results, encounter notes, upcoming appointments, etc.  Non-urgent messages can be sent to your provider as well.   To learn more about what you can do with MyChart, go to [email or web address].    Your next appointment:   6 month(s)  The format for your next appointment:   In Person  Provider:   You may see Meriam Sprague, MD or one of the following Advanced Practice Providers on your designated Care Team:   Tereso Newcomer, PA-C Chelsea Aus, [place]   Other Instructions

## 2020-11-06 ENCOUNTER — Telehealth: Payer: Self-pay | Admitting: Physician Assistant

## 2020-11-06 ENCOUNTER — Other Ambulatory Visit: Payer: Self-pay

## 2020-11-06 DIAGNOSIS — I2510 Atherosclerotic heart disease of native coronary artery without angina pectoris: Secondary | ICD-10-CM

## 2020-11-06 MED ORDER — CLOPIDOGREL BISULFATE 75 MG PO TABS: 75.0000 mg | ORAL_TABLET | Freq: Every day | ORAL | 3 refills | Status: DC

## 2020-11-06 MED ORDER — CLOPIDOGREL BISULFATE 75 MG PO TABS: 75.0000 mg | ORAL_TABLET | Freq: Every day | ORAL | Status: AC

## 2020-11-06 NOTE — Telephone Encounter (Signed)
Spoke with the pt and verbally went over his lab results. His labs were sent to his PCP Malen Gauze PA and his PCP changed his record.

## 2020-11-06 NOTE — Telephone Encounter (Signed)
Per pt request, extensive message re: his lab result left on his voicemail and instructed to please call our office back with any questions.    Lab results sent to his PCP... Dr. Salvadore Farber.

## 2020-11-06 NOTE — Telephone Encounter (Signed)
Patient returning call for lab results. He states to leave the results on his voicemail.

## 2021-01-20 ENCOUNTER — Telehealth: Payer: Self-pay | Admitting: Internal Medicine

## 2021-01-20 NOTE — Telephone Encounter (Signed)
Left detailed message for the pt on his voicemail per his request that today will be the last dose of his Plavix per Dayna Dunn's note but to continue taking his ASA 81 mg daily.   To please let us know if he has any further questions.

## 2021-01-20 NOTE — Telephone Encounter (Signed)
Patient was told to stop taking a specific medication 01/20/21. He states that the voicemail on his phone was erased and he is not sure what medication to stop.   Please leave a detailed message. The patient does not have the ability to use his phone during work

## 2021-02-07 ENCOUNTER — Telehealth: Payer: Self-pay | Admitting: Cardiology

## 2021-02-07 NOTE — Telephone Encounter (Signed)
As indicated by the operator in this message, pt received detailed message left and will call back if needed next week. Will close this encounter and refer to it as needed.

## 2021-02-07 NOTE — Telephone Encounter (Signed)
Left the pt a detailed message, as requested, with Dr. Devin Going recommendations.  Advised the pt to call the office back to further discuss and arrange a lab appointment to check electrolytes, if he's willing to do.  Will await pt to call back.

## 2021-02-07 NOTE — Telephone Encounter (Signed)
Guy [name], Cro[place] D - 02/07/2021  8:26 AM Meriam Sprague, MD  Sent: Caleen Essex February 07, 2021 10:32 AM  To: Dossie Arbour, RN; Loa Socks, LPN          Message  None of his medications should cause numbness/tingling in the hands. If his blood pressure is controlled and no low blood pressures, I would not make changes to his meds at this time. We can certainly check a BMET and Mg to make sure his electrolytes are stable and not low as this may make his symptoms worse.       ----- Message -----  From: Dossie Arbour, RN  Sent: 02/07/2021   9:43 AM EST  To: Loa Socks, LPN, Meriam Sprague, MD

## 2021-02-07 NOTE — Telephone Encounter (Signed)
Left a message for the pt to call the office back.  If pts complaints are STAT when pt returns a call back, please have complaints triaged by a triage nurse.

## 2021-02-07 NOTE — Telephone Encounter (Signed)
I spoke with patient. He reports he has had issues with his hand going to sleep for years.  In the past happened "once in a blue moon" but in the past week it has happened more frequently. His fingers feel numb when he presses on them and his knuckles hurt.  Occurs in both hands but greater in right than left.  No facial droop. No weakness. No trouble with speech. He did feel off balance yesterday but this improved. Does feel "weird" at times when bending over and standing up.  Patient instructed to change positions slowly. Other than tingling/numbness he is feeling fine. Reports BP was 137/73 this AM. He reports recent medication change and is asking if this could be contributing to hand/finger issues or if he could have blood flow problems. Patient states it is OK to leave detailed message on his voicemail

## 2021-02-07 NOTE — Telephone Encounter (Signed)
STAT if patient feels like he/she is going to faint   Are you dizzy now? No   Do you feel faint or have you passed out? No   Do you have any other symptoms? Balance was off; right arm going to sleep; tingling in fingers  Have you checked your HR and BP (record if available)? No

## 2021-02-07 NOTE — Telephone Encounter (Signed)
Tried calling pt back to follow-up from message previously left for him, with Dr. Devin Going recommendations.   Pt did not answer.

## 2021-02-07 NOTE — Telephone Encounter (Signed)
He got your message, he is going to try to do some of the stuff you said in the message over the weekend.  If he has any trouble still next week he will call.

## 2021-02-07 NOTE — Telephone Encounter (Signed)
Left the pt a message to call the office back. 

## 2021-02-13 ENCOUNTER — Telehealth: Payer: Self-pay | Admitting: Cardiology

## 2021-02-13 MED ORDER — AMLODIPINE BESYLATE 2.5 MG PO TABS: 2.5000 mg | ORAL_TABLET | Freq: Every day | ORAL | 3 refills | Status: DC

## 2021-02-13 MED ORDER — ATORVASTATIN CALCIUM 80 MG PO TABS: 80.0000 mg | ORAL_TABLET | Freq: Every day | ORAL | 3 refills | Status: DC

## 2021-02-13 MED ORDER — LOSARTAN POTASSIUM 50 MG PO TABS: 50.0000 mg | ORAL_TABLET | Freq: Every day | ORAL | 3 refills | Status: DC

## 2021-02-13 MED ORDER — METOPROLOL SUCCINATE ER 25 MG PO TB24: 12.5000 mg | ORAL_TABLET | Freq: Every day | ORAL | 3 refills | Status: DC

## 2021-02-13 NOTE — Telephone Encounter (Signed)
Refills for Losartan, Atorvastatin, Amlodipine, & Metoprolol has been sent to [facility], per pt request.

## 2021-02-13 NOTE — Telephone Encounter (Signed)
*  STAT* If patient is at the pharmacy, call can be transferred to refill team.   1. Which medications need to be refilled? (please list name of each medication and dose if known)  new prescription for Atorvastatin and Amlodipine Metoprolol and Losartan- changing pharmacy- some of these might not need to be refilled at this time  2. Which pharmacy/location (including street and city if local pharmacy) is medication to be sent to?[facility] [place], [place]  3. Do they need a 30 day or 90 day supply? 90 days and refills if not

## 2021-04-28 NOTE — Progress Notes (Deleted)
Cardiology Office Note:    Date:  04/28/2021   ID:  [name], DOB [birth year], MRN [id number]  PCP:  Guy [name]  [facility] Cardiologist:  Freada Bergeron, MD  [facility] Electrophysiologist:  None   Referring MD: Curly Rim, MD     History of Present Illness:    Guy [name] is a 56 y.o. male with a hx of HTN, HLD, DMII and NSTEMI s/p PCI to RCA who presents to clinic for follow-up.  Patient was admitted to [facility] hospital in 01/2020 with episode of chest pain/indigestion. Trop elevated at 774 consistent with NSTEMI. Underwent LHC which showed multivessel CAD with culprit lesion being the RCA with 99% stenosis. He was treated with DES to RCA. LVEDP noted to be 3mmHg. He had transient AV block when crossing the aortic valve which responded to atropine. Post procedure recommendations for DAPT with Aspirin and Brilinta for at least 1 year. Echo showed LVEF 60-65%, no WMA, G1DD, trivial MR.   During visit on 02/23/20, the patient continued to have shortness of breath which was thought to be due to ticagrelor. He was ultimately changed to plavix with resolution of symptoms.  Last saw Melina Copa on 11/05/20 where he was doing well. Had some generalized fatigue but no chest pain and remained active without anginal symptoms.  Today, ***   Past Medical History:  Diagnosis Date   CAD (coronary artery disease)    CKD (chronic kidney disease), stage II    Collapsed lung    DDD (degenerative disc disease), cervical    Diabetes mellitus without complication (Guy [name])    Prior A1C remotely 8.9, changed diet, most recently 5.7   Enlarged prostate    Essential hypertension    HTN (hypertension)    Hyperlipidemia LDL goal <70     Past Surgical History:  Procedure Laterality Date   APPENDECTOMY     CORONARY STENT INTERVENTION N/A 01/22/2020   Procedure: CORONARY STENT INTERVENTION;  Surgeon: Troy Sine, MD;  Location: [place] [facility];   Service: Cardiovascular;  Laterality: N/A;   HERNIA REPAIR     LEFT HEART CATH AND CORONARY ANGIOGRAPHY N/A 01/22/2020   Procedure: LEFT HEART CATH AND CORONARY ANGIOGRAPHY;  Surgeon: Troy Sine, MD;  Location: [place] [facility];  Service: Cardiovascular;  Laterality: N/A;    Current Medications: No outpatient medications have been marked as taking for the 05/06/21 encounter (Appointment) with Freada Bergeron, MD.     Allergies:   Penicillins and Sulfa antibiotics   Social History   Socioeconomic History   Marital status: Divorced    Spouse name: Not on file   Number of children: Not on file   Years of education: Not on file   Highest education level: Not on file  Occupational History   Not on file  Tobacco Use   Smoking status: Former   Smokeless tobacco: Never  Substance and Sexual Activity   Alcohol use: No   Drug use: No   Sexual activity: Yes  Other Topics Concern   Not on file  Social History Narrative   Lives in [place].  Works at [employer]: Not on [employer] Insecurity: Not on file  Transportation Needs: Not on file  Physical Activity: Not on file  Stress: Not on file  Social Connections: Not on file     Family History: The patient's family history includes CAD  in his maternal grandfather and maternal grandmother; Diabetes in an other family member.  ROS:   Please see the history of present illness.    Review of Systems  Constitutional:  Negative for chills, fever and weight loss.  HENT:  Positive for nosebleeds.   Eyes:  Negative for blurred vision and redness.  Respiratory:  Negative for shortness of breath.   Cardiovascular:  Negative for chest pain, palpitations, orthopnea, claudication, leg swelling and PND.  Gastrointestinal:  Negative for melena, nausea and vomiting.  Genitourinary:  Negative for hematuria.  Musculoskeletal:  Negative for falls.  Neurological:  Negative for  dizziness and loss of consciousness.  Endo/Heme/Allergies:  Negative for polydipsia.  Psychiatric/Behavioral:  Negative for substance abuse.    EKGs/Labs/Other Studies Reviewed:    The following studies were reviewed today: Echo 01/21/20  1. Left ventricular ejection fraction, by estimation, is 60 to 65%. The  left ventricle has normal function. The left ventricle has no regional  wall motion abnormalities. Left ventricular diastolic parameters are  consistent with Grade I diastolic  dysfunction (impaired relaxation).   2. Right ventricular systolic function is normal. The right ventricular  size is normal.   3. The mitral valve is normal in structure. Trivial mitral valve  regurgitation. No evidence of mitral stenosis.   4. The aortic valve is tricuspid. Aortic valve regurgitation is not  visualized. No aortic stenosis is present.   5. The inferior vena cava is normal in size with greater than 50%  respiratory variability, suggesting right atrial pressure of 3 mmHg.    Cardiac cath 01/22/20 Mid LAD to Dist LAD lesion is 50% stenosed. Mid LAD-1 lesion is 30% stenosed. Mid LAD-2 lesion is 50% stenosed. 2nd Mrg lesion is 40% stenosed. Prox Cx to Mid Cx lesion is 20% stenosed. A stent was successfully placed. Prox RCA lesion is 99% stenosed. Post intervention, there is a 0% residual stenosis. Prox RCA to Mid RCA lesion is 40% stenosed. Post intervention, there is a 0% residual stenosis.   Multivessel CAD with irregularity of the mid LAD with narrowings of 30 to 50%, mild 20% narrowing in the proximal circumflex with 40% narrowing in the OM vessel; and large dominant RCA with subtotal thrombotic 99% proximal stenosis in the region of mild to moderate plaque above and below the stenosis and 40% smooth mid PLA stenosis   LVEDP 17 mm Hg.   Successful percutaneous coronary intervention with PTCA and DES stenting with a 3.5 x 34 mm Resolute Onyx stent postdilated to 3.80 milliliters with  a 99% stenosis moderate narrowings involving below the lesion being reduced to 0%.  There is brisk TIMI-3 flow with no evidence for dissection.   Transient AV block when crossing the aortic valve which responded to atropine.   RECOMMENDATION: DAPT for at least 1 year.  Aggressive lipid-lowering therapy with target LDL less than 70.  Optimal blood pressure control with goal less than 130/80 and ideal less than 120/80.   Coronary Diagrams   Diagnostic Dominance: Right  Intervention         Recent Labs: 11/05/2020: ALT 25; BUN 17; Creatinine, Ser 1.18; Hemoglobin 14.0; Platelets 231; Potassium 3.8; Sodium 137; TSH 2.190  Recent Lipid Panel    Component Value Date/Time   CHOL 105 11/05/2020 0948   TRIG 110 11/05/2020 0948   HDL 29 (L) 11/05/2020 0948   CHOLHDL 3.6 11/05/2020 0948   CHOLHDL 5.1 01/21/2020 0308   VLDL 23 01/21/2020 0308   LDLCALC 55 11/05/2020 [id number]  Physical Exam:    VS:  There were no vitals taken for this visit.    Wt Readings from Last 3 Encounters:  11/05/20 200 lb 12.8 oz (91.1 kg)  04/19/20 199 lb (90.3 kg)  02/23/20 199 lb 3.2 oz (90.4 kg)     GEN:  Well nourished, well developed in no acute distress HEENT: Normal NECK: No JVD; No carotid bruits CARDIAC: RRR, no murmurs, rubs, gallops RESPIRATORY:  Clear to auscultation without rales, wheezing or rhonchi  ABDOMEN: Soft, non-tender, non-distended MUSCULOSKELETAL:  No edema; No deformity  SKIN: Warm and dry NEUROLOGIC:  Alert and oriented x 3 PSYCHIATRIC:  Normal affect   ASSESSMENT:    No diagnosis found.  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 S/p PCI to RCA. Doing well other than SOB with ticagrelor. Very active walking 5-46miles per day. Compliant with all medications. - Echo showed LVEF 60-65%, no WMA, G1DD, trivial MR - Continue ASA 81mg  daily - Plavix stopped 1 year post-PCI per patient wished - Stopped brilinta due to SOB - Continue Atorvastatin 80mg  daily - Continue metoprolol 12.5mg  XL - Continue losartan 50mg  daily  #HTN - Continue Lopressor 12.5mg  XL - Continue losartan 50mg  daily - Continue amlodipine 2.5mg  daily - Monitor blood pressures at home with goal 120/80s or below   #HLD - Atorvastatin 80 mg daily  #DM2 -Management per PCP     Medication Adjustments/Labs and Tests Ordered: Current medicines are reviewed at length with the patient today.  Concerns regarding medicines are outlined above.  No orders of the defined types were placed in this encounter.  No orders of the defined types were placed in this encounter.   There are no Patient Instructions on file for this visit.    Signed, Freada Bergeron, MD  04/28/2021 7:30 PM    [place]

## 2021-05-06 ENCOUNTER — Encounter (INDEPENDENT_AMBULATORY_CARE_PROVIDER_SITE_OTHER): Payer: Self-pay

## 2021-05-06 ENCOUNTER — Other Ambulatory Visit: Payer: Self-pay

## 2021-05-06 ENCOUNTER — Ambulatory Visit (INDEPENDENT_AMBULATORY_CARE_PROVIDER_SITE_OTHER): Payer: 59 | Admitting: Cardiology

## 2021-05-06 ENCOUNTER — Encounter: Payer: Self-pay | Admitting: Cardiology

## 2021-05-06 VITALS — BP 128/78 | HR 61 | Ht 70.0 in | Wt 205.0 lb

## 2021-05-06 DIAGNOSIS — I2510 Atherosclerotic heart disease of native coronary artery without angina pectoris: Secondary | ICD-10-CM | POA: Diagnosis not present

## 2021-05-06 DIAGNOSIS — E119 Type 2 diabetes mellitus without complications: Secondary | ICD-10-CM

## 2021-05-06 DIAGNOSIS — I214 Non-ST elevation (NSTEMI) myocardial infarction: Secondary | ICD-10-CM

## 2021-05-06 DIAGNOSIS — I10 Essential (primary) hypertension: Secondary | ICD-10-CM

## 2021-05-06 DIAGNOSIS — R5383 Other fatigue: Secondary | ICD-10-CM

## 2021-05-06 DIAGNOSIS — I25118 Atherosclerotic heart disease of native coronary artery with other forms of angina pectoris: Secondary | ICD-10-CM

## 2021-05-06 DIAGNOSIS — N182 Chronic kidney disease, stage 2 (mild): Secondary | ICD-10-CM

## 2021-05-06 DIAGNOSIS — E782 Mixed hyperlipidemia: Secondary | ICD-10-CM

## 2021-05-06 NOTE — Patient Instructions (Signed)
Medication Instructions:   Your physician recommends that you continue on your current medications as directed. Please refer to the Current Medication list given to you today.  *If you need a refill on your cardiac medications before your next appointment, please call your pharmacy*   Lab Work:  WE HAVE PROVIDED YOU WITH A LAB ORDER ON A PRESCRIPTION PAD TO GIVE TO YOUR PCP AND HAVE THEM DRAW FASTING LIPIDS ON YOU--HAVE PCP FAX RESULTS TO [phone] ATTN: DR. Shari Prows   If you have labs (blood work) drawn today and your tests are completely normal, you will receive your results only by: MyChart Message (if you have MyChart) OR A paper copy in the mail If you have any lab test that is abnormal or we need to change your treatment, we will call you to review the results.   Follow-Up: At [facility], you and your health needs are our priority.  As part of our continuing mission to provide you with exceptional heart care, we have created designated Provider Care Teams.  These Care Teams include your primary Cardiologist (physician) and Advanced Practice Providers (APPs -  Physician Assistants and Nurse Practitioners) who all work together to provide you with the care you need, when you need it.  We recommend signing up for the patient portal called "MyChart".  Sign up information is provided on this After Visit Summary.  MyChart is used to connect with patients for Virtual Visits (Telemedicine).  Patients are able to view lab/test results, encounter notes, upcoming appointments, etc.  Non-urgent messages can be sent to your provider as well.   To learn more about what you can do with MyChart, go to [email or web address].    Your next appointment:   6 month(s)  The format for your next appointment:   In Person  Provider:   Meriam Sprague, MD {

## 2021-05-06 NOTE — Progress Notes (Signed)
Cardiology Office Note:    Date:  05/06/2021   ID:  Guy [name], DOB 30-Dec-1965, MRN [id number]  PCP:  Gillis Ends  [facility] Cardiologist:  Freada Bergeron, MD  [facility] Electrophysiologist:  None   Referring MD: Curly Rim, MD    History of Present Illness:    Guy [name] is a 56 y.o. male with a hx of HTN, HLD, DMII and NSTEMI s/p PCI to RCA who presents to clinic for follow-up.  Patient was admitted to [facility] hospital in 01/2020 with episode of chest pain/indigestion. Trop elevated at 774 consistent with NSTEMI. Underwent LHC which showed multivessel CAD with culprit lesion being the RCA with 99% stenosis. He was treated with DES to RCA. LVEDP noted to be 21mmHg. He had transient AV block when crossing the aortic valve which responded to atropine. Post procedure recommendations for DAPT with Aspirin and Brilinta for at least 1 year. Echo showed LVEF 60-65%, no WMA, G1DD, trivial MR.   During visit on 02/23/20, the patient continued to have shortness of breath which was thought to be due to ticagrelor. He was ultimately changed to plavix with resolution of symptoms.  Last saw Melina Copa on 11/05/20 where he was doing well. Had some generalized fatigue but no chest pain and remained active without anginal symptoms.  Today, the patient states that he is feeling pretty good overall. He continues to suffer from feelings of malaise/fatigue and a lack of motivation. This has been ongoing since his PCI in 01/2020. 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 Now he feels more lethargic and needs to push himself to lift weights and use resistance bands in the morning. He notes breathing heavily, but generally he feels better once he is exercising. No anginal or HF symptoms. He has lost about 3 lbs since Christmas.He believes that he did feel better when his metoprolol was reduced after last visit.    Regarding his diet he usually eats chicken, [place] sausage, carrots, green beans, and broccoli. Cooks with olive oil.  He denies any palpitations, chest pain, or peripheral edema. No lightheadedness, headaches, syncope, orthopnea, or PND.   Past Medical History:  Diagnosis Date   CAD (coronary artery disease)    CKD (chronic kidney disease), stage II    Collapsed lung    DDD (degenerative disc disease), cervical    Diabetes mellitus without complication ([place])    Prior A1C remotely 8.9, changed diet, most recently 5.7   Enlarged prostate    Essential hypertension    HTN (hypertension)    Hyperlipidemia LDL goal <70     Past Surgical History:  Procedure Laterality Date   APPENDECTOMY     CORONARY STENT INTERVENTION N/A 01/22/2020   Procedure: CORONARY STENT INTERVENTION;  Surgeon: Troy Sine, MD;  Location: [place] [facility];  Service: Cardiovascular;  Laterality: N/A;   HERNIA REPAIR     LEFT HEART CATH AND CORONARY ANGIOGRAPHY N/A 01/22/2020   Procedure: LEFT HEART CATH AND CORONARY ANGIOGRAPHY;  Surgeon: Troy Sine, MD;  Location: [place] [facility];  Service: Cardiovascular;  Laterality: N/A;    Current Medications: Current Meds  Medication Sig   amLODipine (NORVASC) 2.5 MG tablet Take 1 tablet (2.5 mg total) by mouth daily.   aspirin EC 81 MG tablet Take 81 mg by mouth daily. Swallow whole.   atorvastatin (LIPITOR) 80 MG tablet Take 1 tablet (80  mg total) by mouth daily.   losartan (COZAAR) 50 MG tablet Take 1 tablet (50 mg total) by mouth daily.   metoprolol succinate (TOPROL XL) 25 MG 24 hr tablet Take 0.5 tablets (12.5 mg total) by mouth at bedtime.     Allergies:   Penicillins and Sulfa antibiotics   Social History   Socioeconomic History   Marital status: Divorced    Spouse name: Not on file   Number of children: Not on file   Years of education: Not on file   Highest education level: Not on file  Occupational History   Not on file  Tobacco  Use   Smoking status: Former   Smokeless tobacco: Never  Substance and Sexual Activity   Alcohol use: No   Drug use: No   Sexual activity: Yes  Other Topics Concern   Not on file  Social History Narrative   Lives in [place].  Works at [employer]: Not on [employer] Insecurity: Not on file  Transportation Needs: Not on file  Physical Activity: Not on file  Stress: Not on file  Social Connections: Not on file     Family History: The patient's family history includes CAD in his maternal grandfather and maternal grandmother; Diabetes in an other family member.  ROS:   Please see the history of present illness.    Review of Systems  Constitutional:  Positive for malaise/fatigue. Negative for chills and fever.  HENT:  Negative for nosebleeds.   Eyes:  Negative for blurred vision and redness.  Respiratory:  Positive for shortness of breath.   Cardiovascular:  Negative for chest pain, palpitations, orthopnea, claudication, leg swelling and PND.  Gastrointestinal:  Negative for melena, nausea and vomiting.  Genitourinary:  Negative for hematuria.  Musculoskeletal:  Negative for falls.  Neurological:  Negative for dizziness and loss of consciousness.  Endo/Heme/Allergies:  Negative for polydipsia.  Psychiatric/Behavioral:  Negative for substance abuse.    EKGs/Labs/Other Studies Reviewed:    The following studies were reviewed today:  Echo 01/21/20  1. Left ventricular ejection fraction, by estimation, is 60 to 65%. The  left ventricle has normal function. The left ventricle has no regional  wall motion abnormalities. Left ventricular diastolic parameters are  consistent with Grade I diastolic  dysfunction (impaired relaxation).   2. Right ventricular systolic function is normal. The right ventricular  size is normal.   3. The mitral valve is normal in structure. Trivial mitral valve  regurgitation. No evidence of  mitral stenosis.   4. The aortic valve is tricuspid. Aortic valve regurgitation is not  visualized. No aortic stenosis is present.   5. The inferior vena cava is normal in size with greater than 50%  respiratory variability, suggesting right atrial pressure of 3 mmHg.    Cardiac cath 01/22/20 Mid LAD to Dist LAD lesion is 50% stenosed. Mid LAD-1 lesion is 30% stenosed. Mid LAD-2 lesion is 50% stenosed. 2nd Mrg lesion is 40% stenosed. Prox Cx to Mid Cx lesion is 20% stenosed. A stent was successfully placed. Prox RCA lesion is 99% stenosed. Post intervention, there is a 0% residual stenosis. Prox RCA to Mid RCA lesion is 40% stenosed. Post intervention, there is a 0% residual stenosis.   Multivessel CAD with irregularity of the mid LAD with narrowings of 30 to 50%, mild 20% narrowing in the proximal circumflex with 40% narrowing in the OM vessel; and large dominant RCA with subtotal  thrombotic 99% proximal stenosis in the region of mild to moderate plaque above and below the stenosis and 40% smooth mid PLA stenosis   LVEDP 17 mm Hg.   Successful percutaneous coronary intervention with PTCA and DES stenting with a 3.5 x 34 mm Resolute Onyx stent postdilated to 3.80 milliliters with a 99% stenosis moderate narrowings involving below the lesion being reduced to 0%.  There is brisk TIMI-3 flow with no evidence for dissection.   Transient AV block when crossing the aortic valve which responded to atropine.   RECOMMENDATION: DAPT for at least 1 year.  Aggressive lipid-lowering therapy with target LDL less than 70.  Optimal blood pressure control with goal less than 130/80 and ideal less than 120/80.   Coronary Diagrams   Diagnostic Dominance: Right  Intervention         EKG:  EKG is personally reviewed. 05/06/2021: EKG was not ordered. 11/05/2020 (Dayna Dunn, PA-C): NSR 73 bpm, nonspecific STTW changes inferiorly, stable from prior.  Recent Labs: 11/05/2020: ALT 25; BUN 17;  Creatinine, Ser 1.18; Hemoglobin 14.0; Platelets 231; Potassium 3.8; Sodium 137; TSH 2.190   Recent Lipid Panel    Component Value Date/Time   CHOL 105 11/05/2020 0948   TRIG 110 11/05/2020 0948   HDL 29 (L) 11/05/2020 0948   CHOLHDL 3.6 11/05/2020 0948   CHOLHDL 5.1 01/21/2020 0308   VLDL 23 01/21/2020 0308   LDLCALC 55 11/05/2020 0948     Physical Exam:    VS:  BP 128/78    Pulse 61    Ht 5\' 10"  (1.778 m)    Wt 205 lb (93 kg)    SpO2 99%    BMI 29.41 kg/m     Wt Readings from Last 3 Encounters:  05/06/21 205 lb (93 kg)  11/05/20 200 lb 12.8 oz (91.1 kg)  04/19/20 199 lb (90.3 kg)     GEN:  Well nourished, well developed in no acute distress HEENT: Normal NECK: No JVD; No carotid bruits CARDIAC: RRR, no murmurs, rubs, gallops RESPIRATORY:  Clear to auscultation without rales, wheezing or rhonchi  ABDOMEN: Soft, non-tender, non-distended MUSCULOSKELETAL:  No edema; No deformity  SKIN: Warm and dry NEUROLOGIC:  Alert and oriented x 3 PSYCHIATRIC:  Normal affect   ASSESSMENT:    1. CAD in native artery   2. Coronary artery disease of native heart with stable angina pectoris, unspecified vessel or lesion type ([place])   3. Mixed hyperlipidemia   4. Essential hypertension   5. CKD (chronic kidney disease), stage II   6. Fatigue, unspecified type   7. Type 2 diabetes mellitus without complication, without long-term current use of insulin ([place])   8. NSTEMI (non-ST elevated myocardial infarction) ([place])    PLAN:    In order of problems listed above:  #NSTEMI #Multivessel CAD s/p PCI to RCA Multivessel CAD with irregularity of the mid LAD with narrowings of 30 to 50%, mild 20% narrowing in the proximal circumflex with 40% narrowing in the OM vessel; and large dominant RCA with subtotal thrombotic 99% proximal stenosis in the region of mild to moderate plaque above and below the stenosis and 40% smooth mid PLA stenosis. S/p PCI to RCA. Doing well other than mild fatigue.  Remains very active without anginal or HF symptoms.  - Echo showed LVEF 60-65%, no WMA, G1DD, trivial MR - Continue ASA 81mg  daily - Plavix stopped 1 year post-PCI per patient wished - Stopped brilinta due to SOB - Continue Atorvastatin 80mg  daily -  Will trial off metop to see if fatigue resolves - Continue losartan 50mg  daily  #HTN Well controlled and at goal. - Will hold metop to see if fatigue improves - Continue losartan 50mg  daily - Continue amlodipine 2.5mg  daily - Monitor blood pressures at home with goal 120/80s or below   #HLD - Atorvastatin 80 mg daily - Last LDL 11/2020 at goal 55 - Will repeat lipids at PCP office  #DM2 -Management per PCP   Follow-up:  6 months.  Medication Adjustments/Labs and Tests Ordered: Current medicines are reviewed at length with the patient today.  Concerns regarding medicines are outlined above.   No orders of the defined types were placed in this encounter.  No orders of the defined types were placed in this encounter.  Patient Instructions  Medication Instructions:   Your physician recommends that you continue on your current medications as directed. Please refer to the Current Medication list given to you today.  *If you need a refill on your cardiac medications before your next appointment, please call your pharmacy*   Lab Work:  WE HAVE PROVIDED YOU WITH A LAB ORDER ON A PRESCRIPTION PAD TO GIVE TO [place] PCP AND HAVE THEM DRAW FASTING LIPIDS ON [id number] PCP FAX RESULTS TO [phone] ATTN: DR. Johney Frame   If you have labs (blood work) drawn today and your tests are completely normal, you will receive your results only by: [place] (if you have MyChart) OR A paper copy in the mail If you have any lab test that is abnormal or we need to change your treatment, we will call you to review the results.   Follow-Up: At [facility], you and your health needs are our priority.  As part of our continuing mission to  provide you with exceptional heart care, we have created designated Provider Care Teams.  These Care Teams include your primary Cardiologist (physician) and Advanced Practice Providers (APPs -  Physician Assistants and Nurse Practitioners) who all work together to provide you with the care you need, when you need it.  We recommend signing up for the patient portal called "MyChart".  Sign up information is provided on this After Visit Summary.  MyChart is used to connect with patients for Virtual Visits (Telemedicine).  Patients are able to view lab/test results, encounter notes, upcoming appointments, etc.  Non-urgent messages can be sent to your provider as well.   To learn more about what you can do with MyChart, go to [email or web address].    Your next appointment:   6 month(s)  The format for your next appointment:   In Person  Provider:   Freada Bergeron, MD {    [facility],acting as a scribe for Freada Bergeron, MD.,have documented all relevant documentation on the behalf of Freada Bergeron, MD,as directed by  Freada Bergeron, MD while in the presence of Freada Bergeron, MD.  I, Freada Bergeron, MD, have reviewed all documentation for this visit. The documentation on 05/06/21 for the exam, diagnosis, procedures, and orders are all accurate and complete.    Signed, Freada Bergeron, MD  05/06/2021 9:07 AM    [place]

## 2021-06-16 ENCOUNTER — Other Ambulatory Visit: Payer: Self-pay

## 2021-06-16 MED ORDER — LOSARTAN POTASSIUM 50 MG PO TABS: 50.0000 mg | ORAL_TABLET | Freq: Every day | ORAL | 9 refills | Status: DC

## 2021-06-16 MED ORDER — ATORVASTATIN CALCIUM 80 MG PO TABS: 80.0000 mg | ORAL_TABLET | Freq: Every day | ORAL | 9 refills | Status: DC

## 2021-06-16 MED ORDER — AMLODIPINE BESYLATE 2.5 MG PO TABS: 2.5000 mg | ORAL_TABLET | Freq: Every day | ORAL | 9 refills | Status: DC

## 2021-07-18 ENCOUNTER — Telehealth: Payer: Self-pay | Admitting: *Deleted

## 2021-07-18 MED ORDER — ROSUVASTATIN CALCIUM 20 MG PO TABS: 20.0000 mg | ORAL_TABLET | Freq: Every day | ORAL | 3 refills | Status: DC

## 2021-07-18 NOTE — Telephone Encounter (Signed)
Called the pt and endorsed to him that Dr. Shari Prows received his recent lipid results from his PCP.  He is aware that she reviewed them and endorsed that his cholesterol looks great and to continue his current regimen.  Pt ask if he could reduce his lipitor or be switched to an alternative statin, due to muscle aches and feeling fatigued. ? ?Spoke with Dr. Shari Prows about this and she provided verbal orders for the pt to stop lipitor and start crestor 20 mg po daily. ?Confirmed the pharmacy of choice with the pt.  ?Pt verbalized understanding and agrees with this plan.  ? ?Lab report signed by Dr. Shari Prows and placed in med rec box to be scanned.  ?

## 2021-07-23 NOTE — Telephone Encounter (Signed)
Spoke with the pt and he is aware that he is to stop atorvastatin and start crestor 20 mg po daily.  This change was made last week, due to faxed labs sent by his PCP to Dr. Shari Prows, showed his cholesterol still not being at goal.  Med changes made last week and pt aware to stop lipitor and new med crestor 20 mg po daily was sent to his pharmacy of choice, and he is to start taking that med instead. ?Pt verbalized understanding and agrees with this plan. ?

## 2021-07-23 NOTE — Telephone Encounter (Signed)
Pt c/o medication issue: ? ?1. Name of Medication: rosuvastatin (CRESTOR) 20 MG tablet ? ?2. How are you currently taking this medication (dosage and times per day)? As prescribed  ? ?3. Are you having a reaction (difficulty breathing--STAT)? No ? ?4. What is your medication issue? Pt says he wasn't given clear instruction on new medication and if he needs to stop taking lipitor or if pt should be taking this along with the crestor.  ? ?Pt also stated that if he doesn't answer to please leave a v/m with details.  ?

## 2021-07-23 NOTE — Telephone Encounter (Signed)
Left the pt a message to call the office back, to endorse again his lab results and medication recommendations per Dr. Shari Prows.  ?

## 2021-08-12 ENCOUNTER — Telehealth: Payer: Self-pay | Admitting: Cardiology

## 2021-08-12 NOTE — Telephone Encounter (Signed)
Pt c/o medication issue:  1. Name of Medication: rosuvastatin (CRESTOR) 20 MG tablet  2. How are you currently taking this medication (dosage and times per day)? As directed   3. Are you having a reaction (difficulty breathing--STAT)?  Dull headache   4. What is your medication issue?  Pt has been having a dull headache for over a week of taking the medication. Wants to know Dr. Devin Going recommendation. He asks that you please leave a voicemail/message due to phone issues.

## 2021-08-12 NOTE — Telephone Encounter (Signed)
Will forward this message to Dr. Johney Frame and PharmD team to further review and advise.  Will follow-up with the pt accordingly thereafter.

## 2021-08-13 NOTE — Telephone Encounter (Signed)
Left the pt a message to call the office back to endorse recommendations per Dr. Shari Prows.  Advised him if he calls back tomorrow, to request to speak with a triage nurse, for further assistance and placement of lipid clinic referral/appt.

## 2021-08-13 NOTE — Telephone Encounter (Signed)
Left the pt a message to call the office back to endorse recommendations per Dr. Pemberton. 

## 2021-08-13 NOTE — Telephone Encounter (Signed)
Could be related to the crestor as HA is a common reaction. Since he has failed both lipitor and crestor, do you think we could get him into lipid clinic for consideration of PCSK9i?

## 2021-08-15 ENCOUNTER — Telehealth: Payer: Self-pay | Admitting: Cardiology

## 2021-08-15 DIAGNOSIS — I2510 Atherosclerotic heart disease of native coronary artery without angina pectoris: Secondary | ICD-10-CM

## 2021-08-15 DIAGNOSIS — Z79899 Other long term (current) drug therapy: Secondary | ICD-10-CM

## 2021-08-15 DIAGNOSIS — Z789 Other specified health status: Secondary | ICD-10-CM

## 2021-08-15 DIAGNOSIS — E782 Mixed hyperlipidemia: Secondary | ICD-10-CM

## 2021-08-15 DIAGNOSIS — I25118 Atherosclerotic heart disease of native coronary artery with other forms of angina pectoris: Secondary | ICD-10-CM

## 2021-08-15 NOTE — Telephone Encounter (Signed)
Pemberton, Heather E, MD at 08/13/2021  2:31 PM  Status: Signed  Could be related to the crestor as HA is a common reaction. Since he has failed both lipitor and crestor, do you think we could get him into lipid clinic for consideration of PCSK9i?       Left the pt a detailed message (as he requested) stating that per Dr. Pemberton, his crestor can cause headaches and being he cannot tolerate crestor or lipitor, she wants to refer him to our lipid clinic to see our Pharmacist, for consideration of PCSK9-Inhibitors.   Left him a detailed message that I will go ahead and place the referral to lipid clinic in the system and send a message to our [facility] Schedulers to call him back and arrange this appt.  Advised him to pick the call up when scheduling team calls him.  Will discontinue his crestor and update this as an intolerance in his med list.  Lipid clinic referral placed.  Left him a detailed message to call the office back with any additional questions or concerns with message left.  

## 2021-08-15 NOTE — Telephone Encounter (Signed)
RE: REFER TO LIPID CLINIC PER DR. PEMBERTON Received: Today [name], Guy W  Guy [name] M, LPN Appt on 09/16/21 @ 9:30am with PHARM D  

## 2021-08-15 NOTE — Telephone Encounter (Signed)
Follow Up:      Patient is calling back. Patient says if he does not answer the phone, it is because he have poor reception. He says if he does not answer, please leave a detailed message of what he needs to do about his medicine please.

## 2021-08-15 NOTE — Telephone Encounter (Signed)
RE: REFER TO LIPID CLINIC PER DR. Shari Prows Received: Today Loletta Parish, LPN Appt on 0/98/11 @ 9:30am with PHARM D

## 2021-08-15 NOTE — Telephone Encounter (Signed)
Freada Bergeron, MD at 08/13/2021  2:31 PM  Status: Signed  Could be related to the crestor as HA is a common reaction. Since he has failed both lipitor and crestor, do you think we could get him into lipid clinic for consideration of PCSK9i?       Left the pt a detailed message (as he requested) stating that per Dr. Johney Frame, his crestor can cause headaches and being he cannot tolerate crestor or lipitor, she wants to refer him to our lipid clinic to see our Pharmacist, for consideration of PCSK9-Inhibitors.   Left him a detailed message that I will go ahead and place the referral to lipid clinic in the system and send a message to our [facility] Schedulers to call him back and arrange this appt.  Advised him to pick the call up when scheduling team calls him.  Will discontinue his crestor and update this as an intolerance in his med list.  Lipid clinic referral placed.  Left him a detailed message to call the office back with any additional questions or concerns with message left.

## 2021-08-18 ENCOUNTER — Telehealth: Payer: Self-pay | Admitting: *Deleted

## 2021-08-18 NOTE — Telephone Encounter (Signed)
-----   Message from Cheree Ditto, [facility] sent at 08/18/2021  8:27 AM EDT ----- Regarding: RE: REFER TO LIPID CLINIC PER DR. Shari Prows Patient scheduled for 7/11  ----- Message ----- From: Loa Socks, LPN Sent: 7/0/2637   8:54 AM EDT To: Pricilla Holm; [facility]; # Subject: REFER TO LIPID CLINIC PER DR. Shari Prows        Dr. Shari Prows referred this pt to lipid clinic for statin intolerance and for consideration of PCSK9-Inhibitors.   Referral is in.  Please call the pt to arrange appt and shoot me the date to give to Dr. Shari Prows.   Thanks [employer]

## 2021-09-16 ENCOUNTER — Ambulatory Visit: Payer: 59

## 2021-10-10 ENCOUNTER — Telehealth: Payer: Self-pay | Admitting: Cardiology

## 2021-10-10 NOTE — Telephone Encounter (Signed)
  Pt is requesting if Dr. Shari Prows can give her another prescription for his cholesterol. He said, his insurance doesn't cover pharmD appts, he is under the impression that if he do a telephone visit he wont have to pay for it. He said, he will just request the medication to Dr. Shari Prows

## 2021-10-14 NOTE — Telephone Encounter (Signed)
Visit will be billed under E&M code [id number] under Dr Devin Going name.  Should be covered under his [employer].  Unsure about workers comp though.

## 2021-10-15 NOTE — Telephone Encounter (Signed)
I'm not sure what to do with this response from billing

## 2021-11-03 NOTE — Progress Notes (Unsigned)
Patient ID: Guy [name]                 DOB: [birth year]                    MRN: [id number]     HPI: Guy [name] is a 56 y.o. male patient referred to lipid clinic by Dr. Shari Prows. PMH is significant for  HTN, HLD, T2DM and CAD. Pt had NSTEMI 01/20/20, LHC showed mvCAD with RCA 99% stenosed s/p DES to RCA. LDL was 55 on atorvastatin 80 mg daily in 2022, pt was switched to rosuvastatin 20 mg 07/18/21 due to muscle aches and fatigue on atorvastatin. Pt called into clinic describing dull headache from rosuvastatin 20 mg daily and requested other medications for lowering cholesterol and pt was referred to lipid clinic.   Pt presents to lipid clinic today. He confirms that he has not been taking rosuvastatin since June. He also shares that he recently had lipid labs done at [facility] and said that his LDL came back up in the 100s. He also shares that he is not taking metformin as he could not tolerate it. He has controlled his diabetes with diet alone, most recent A1C 5.7.% 01/20/20. Pt confirms muscle aches and fatigue on atorvastatin 80 mg daily and headaches on rosuvastatin 20 mg daily. He asked about trying atorvastatin again since the headaches on rosuvastatin were unbearable. Patient is very active, walks about 6 miles each day as well as a 15 minute weight routine in the morning. Diet consists of lean proteins and veggies, but pt does drink pepsi every day, occasionally has a cookie or PB&J when craving something sweet.    Current Medications: none Intolerances: rosuvastatin 20 mg daily (dull headache), atorvastatin 80 mg daily (muscle aches, fatigue)  Risk Factors: CAD, T2DM, HTN, Hx NSTEMI  LDL goal: <55  Diet: chicken, [place] sausage, carrots, green beans, and broccoli. Cooks with olive oil. Breakfast- two eggs and chicken breast, baby spinach  Lunch- chicken breast, broccoli, carrots, green beans  Drinks a pepsi 1-1.5 cans a day Has cookie at mom's every now and then  or a PB&J   Exercise:  Walk every morning 5.5-6.5 miles at work  Is very active at home with yard work  Does 15 minute weight routine in the morning   Family History: The patient's family history includes CAD in his maternal grandfather and maternal grandmother; Diabetes in an other family member. Grandfather - MI   Social History: former smoker, no alcohol   Labs: 07/15/21 TC 101, TG 81, HDL 30, LDL 54 (atorvastatin 80 mg daily)  11/05/20- LDL 105, TG 110, HDL 29, LDL 55 (atorvastatin 80 mg daily)    Past Medical History:  Diagnosis Date   CAD (coronary artery disease)    CKD (chronic kidney disease), stage II    Collapsed lung    DDD (degenerative disc disease), cervical    Diabetes mellitus without complication (HCC)    Prior A1C remotely 8.9, changed diet, most recently 5.7   Enlarged prostate    Essential hypertension    HTN (hypertension)    Hyperlipidemia LDL goal <70     Current Outpatient Medications on File Prior to Visit  Medication Sig Dispense Refill   amLODipine (NORVASC) 2.5 MG tablet Take 1 tablet (2.5 mg total) by mouth daily. 30 tablet 9   aspirin EC 81 MG tablet Take 81 mg by mouth daily. Swallow whole.     losartan (COZAAR) 50  MG tablet Take 1 tablet (50 mg total) by mouth daily. 30 tablet 9   metoprolol succinate (TOPROL XL) 25 MG 24 hr tablet Take 0.5 tablets (12.5 mg total) by mouth at bedtime. (Patient not taking: Reported on 11/04/2021) 45 tablet 3   nitroGLYCERIN (NITROSTAT) 0.4 MG SL tablet PLACE 1 TABLET (0.4 MG TOTAL) UNDER THE TONGUE EVERY FIVE MINUTES X 3 DOSES AS NEEDED FOR CHEST PAIN. 25 tablet 1   [DISCONTINUED] metFORMIN (GLUCOPHAGE) 500 MG tablet Take 1 tablet (500 mg total) by mouth 2 (two) times daily with a meal. (Patient not taking: No sig reported) 20 tablet 0   No current facility-administered medications on file prior to visit.    Allergies  Allergen Reactions   Crestor [Rosuvastatin] Other (See Comments)    Pt reports causes  headaches   Lipitor [Atorvastatin] Other (See Comments)    Reports muscle aches and fatigue   Penicillins Other (See Comments)    Patient states his joints felt like they were locking up  Has patient had a PCN reaction causing immediate rash, facial/tongue/throat swelling, SOB or lightheadedness with hypotension: no Has patient had a PCN reaction causing severe rash involving mucus membranes or skin necrosis: no Has patient had a PCN reaction that required hospitalization: no Has patient had a PCN reaction occurring within the last 10 years: no If all of the above answers are "NO", then may proceed with Cephalosporin use.    Sulfa Antibiotics Other (See Comments)    Patient is unsure of allergy    Assessment/Plan:  1. Hyperlipidemia - Per patient report, LDL in 100s off of LLT is above goal of LDL goal of <55. Discussed option of re-trialing lower dose of statin. Explained to patient that lower doses of statins can often be tolerated even when higher dose have caused muscle symptoms in the past. Patient is willing to re-trial atorvastatin at a lower dose. Will start Atorvastatin 20 mg daily. Schedule patient for follow-up lipid panel, apo B and LFTs in two months. Discussed that at this time if LDL-C is above goal we could consider increasing atorvastatin to 40 mg daily if patient is tolerating or keep atorvastatin at 20 mg daily and add ezetimibe 10 mg daily. Encouraged patient to continue healthy diet and daily exercise. Recommended patient to try drinking half a pepsi instead of a whole pepsi and to instead drink more water.    Thank you,  Larena Sox, PharmD PGY1 Pharmacy Resident   11/04/2021  9:12 AM   Olene Floss, Pharm.D, BCPS, CPP [facility]  [address], [place], [place] [address]  Phone: [phone]; Fax: [phone]

## 2021-11-04 ENCOUNTER — Ambulatory Visit: Payer: 59 | Attending: Cardiology | Admitting: Pharmacist

## 2021-11-04 ENCOUNTER — Ambulatory Visit: Payer: 59 | Admitting: Cardiology

## 2021-11-04 DIAGNOSIS — E782 Mixed hyperlipidemia: Secondary | ICD-10-CM | POA: Diagnosis not present

## 2021-11-04 DIAGNOSIS — I214 Non-ST elevation (NSTEMI) myocardial infarction: Secondary | ICD-10-CM | POA: Diagnosis not present

## 2021-11-04 MED ORDER — ATORVASTATIN CALCIUM 20 MG PO TABS
20.0000 mg | ORAL_TABLET | Freq: Every day | ORAL | 3 refills | Status: DC
Start: 2021-11-04 — End: 2022-04-13

## 2021-11-04 NOTE — Patient Instructions (Addendum)
Start taking atorvastatin 20 mg daily.   We have scheduled you for fasting lipid labs in 2 months on 10/24.  Continue healthy changes with your diet and daily exercise. Try to drink half a pepsi and drink more water instead of a whole pepsi.   Call us at [phone] if you experience any issues with atorvastatin.

## 2021-12-30 ENCOUNTER — Ambulatory Visit: Payer: 59 | Attending: Cardiology

## 2021-12-30 ENCOUNTER — Telehealth: Payer: Self-pay | Admitting: Cardiology

## 2021-12-30 DIAGNOSIS — E782 Mixed hyperlipidemia: Secondary | ICD-10-CM

## 2021-12-30 NOTE — Telephone Encounter (Signed)
Left the pt a message to call the office back to endorse recommendations per Dr. Johney Frame, as indicated in this encounter.

## 2021-12-30 NOTE — Telephone Encounter (Signed)
Will route this message to Dr. Johney Frame for advisement.  Will follow-up with the pt accordingly thereafter.

## 2021-12-30 NOTE — Telephone Encounter (Signed)
Pt is returning call. Transferred call to [facility], LPN.

## 2021-12-30 NOTE — Telephone Encounter (Signed)
I am so sorry that happened. That is incredibly stressful. He can take OTC sleep aides to help him through this time such as zquil, melatonin, unisom. These are good for short periods of time and safe with his medications. Magnesium is also helpful. If he needs a prescription level medication, he can also reach out to his PCP for help.  Again, I am sorry this happened.

## 2021-12-30 NOTE — Telephone Encounter (Signed)
Patient wanted to mentioned about 2 weeks ago they laid everyone off at his work but him and he hasn't been sleeping and wanted to know if she had any advice or anything that could help. Has been walking every morning, but didn't know what else he could do.

## 2021-12-30 NOTE — Telephone Encounter (Signed)
Pt made aware of Dr. Jacolyn Reedy recommendations.  Pt verbalized understanding and agrees with this plan.  Pt was more than gracious for all the assistance provided.

## 2021-12-31 ENCOUNTER — Encounter: Payer: Self-pay | Admitting: Cardiology

## 2021-12-31 LAB — LIPID PANEL
Chol/HDL Ratio: 3.2 ratio (ref 0.0–5.0)
Cholesterol, Total: 100 mg/dL (ref 100–199)
HDL: 31 mg/dL — ABNORMAL LOW (ref >39)
LDL Chol Calc (NIH): 55 mg/dL (ref 0–99)
Triglycerides: 63 mg/dL (ref 0–149)
VLDL Cholesterol Cal: 14 mg/dL (ref 5–40)

## 2021-12-31 LAB — HEPATIC FUNCTION PANEL
ALT: 26 IU/L (ref 0–44)
AST: 27 IU/L (ref 0–40)
Albumin: 4.4 g/dL (ref 3.8–4.9)
Alkaline Phosphatase: 118 IU/L (ref 44–121)
Bilirubin Total: 0.9 mg/dL (ref 0.0–1.2)
Bilirubin, Direct: 0.24 mg/dL (ref 0.00–0.40)
Total Protein: 6.9 g/dL (ref 6.0–8.5)

## 2021-12-31 LAB — APOLIPOPROTEIN B: Apolipoprotein B: 57 mg/dL (ref <90)

## 2021-12-31 NOTE — Telephone Encounter (Signed)
error 

## 2022-03-10 ENCOUNTER — Other Ambulatory Visit: Payer: Self-pay | Admitting: *Deleted

## 2022-03-10 MED ORDER — LOSARTAN POTASSIUM 50 MG PO TABS: 50.0000 mg | ORAL_TABLET | Freq: Every day | ORAL | 0 refills | Status: DC

## 2022-04-08 ENCOUNTER — Other Ambulatory Visit: Payer: Self-pay

## 2022-04-08 MED ORDER — AMLODIPINE BESYLATE 2.5 MG PO TABS: 2.5000 mg | ORAL_TABLET | Freq: Every day | ORAL | 9 refills | Status: DC

## 2022-04-08 MED ORDER — AMLODIPINE BESYLATE 2.5 MG PO TABS: 2.5000 mg | ORAL_TABLET | Freq: Every day | ORAL | 0 refills | Status: DC

## 2022-04-11 NOTE — Progress Notes (Unsigned)
Cardiology Office Note:    Date:  04/13/2022   ID:  Guy [name], DOB June 02, 1965, MRN [id number]  PCP:  Gillis Ends  [facility] Cardiologist:  Freada Bergeron, MD  [facility] Electrophysiologist:  None   Referring MD: Coletta Memos, PA-C    History of Present Illness:    Guy [name] is a 57 y.o. male with a hx of HTN, HLD, DMII and NSTEMI s/p PCI to RCA who presents to clinic for follow-up.  Patient was admitted to [facility] hospital in 01/2020 with episode of chest pain/indigestion. Trop elevated at 774 consistent with NSTEMI. Underwent LHC which showed multivessel CAD with culprit lesion being the RCA with 99% stenosis. He was treated with DES to RCA. LVEDP noted to be 71mmHg. He had transient AV block when crossing the aortic valve which responded to atropine. Post procedure recommendations for DAPT with Aspirin and Brilinta for at least 1 year. Echo showed LVEF 60-65%, no WMA, G1DD, trivial MR.   During visit on 02/23/20, the patient continued to have shortness of breath which was thought to be due to ticagrelor. He was ultimately changed to plavix with resolution of symptoms.  Was last seen in clinic on 04/2021 where he was feeling fatigued. We trialed off metop to see if symptoms improved.  Today, the patient overall feels well today. No chest pain, SOB, orthopnea or PND. Exercising 60minutes every morning and feels well with activity with no anginal symptoms. Walks about 5-64miles per day without issues. Has been working very long hours and this has been stressful.   Otherwise, has been compliant with medications. Blood pressure is controlled and at goal.   Past Medical History:  Diagnosis Date   CAD (coronary artery disease)    CKD (chronic kidney disease), stage II    Collapsed lung    DDD (degenerative disc disease), cervical    Diabetes mellitus without complication ([place])    Prior A1C remotely 8.9, changed diet, most recently 5.7    Enlarged prostate    Essential hypertension    HTN (hypertension)    Hyperlipidemia LDL goal <70     Past Surgical History:  Procedure Laterality Date   APPENDECTOMY     CORONARY STENT INTERVENTION N/A 01/22/2020   Procedure: CORONARY STENT INTERVENTION;  Surgeon: Troy Sine, MD;  Location: [facility] [facility];  Service: Cardiovascular;  Laterality: N/A;   HERNIA REPAIR     LEFT HEART CATH AND CORONARY ANGIOGRAPHY N/A 01/22/2020   Procedure: LEFT HEART CATH AND CORONARY ANGIOGRAPHY;  Surgeon: Troy Sine, MD;  Location: [place] [facility];  Service: Cardiovascular;  Laterality: N/A;    Current Medications: Current Meds  Medication Sig   aspirin EC 81 MG tablet Take 81 mg by mouth daily. Swallow whole.   [DISCONTINUED] amLODipine (NORVASC) 2.5 MG tablet Take 1 tablet (2.5 mg total) by mouth daily. Please keep upcoming appt.with Dr. Johney Frame in Feb.in order to receive future refills. Thank You.   [DISCONTINUED] atorvastatin (LIPITOR) 20 MG tablet Take 1 tablet (20 mg total) by mouth daily.   [DISCONTINUED] losartan (COZAAR) 50 MG tablet Take 1 tablet (50 mg total) by mouth daily.   [DISCONTINUED] metoprolol succinate (TOPROL XL) 25 MG 24 hr tablet Take 0.5 tablets (12.5 mg total) by mouth at bedtime.     Allergies:   Crestor [rosuvastatin], Lipitor [atorvastatin], Penicillins, and Sulfa antibiotics   Social History   Socioeconomic History   Marital status: Divorced    Spouse  name: Not on file   Number of children: Not on file   Years of education: Not on file   Highest education level: Not on file  Occupational History   Not on file  Tobacco Use   Smoking status: Former   Smokeless tobacco: Never  Substance and Sexual Activity   Alcohol use: No   Drug use: No   Sexual activity: Yes  Other Topics Concern   Not on file  Social History Narrative   Lives in [place].  Works at [employer]: Not on [employer] Insecurity: Not on file  Transportation Needs: Not on file  Physical Activity: Not on file  Stress: Not on file  Social Connections: Not on file     Family History: The patient's family history includes CAD in his maternal grandfather and maternal grandmother; Diabetes in an other family member.  ROS:   Please see the history of present illness.    Review of Systems  Constitutional:  Negative for chills and fever.  HENT:  Negative for nosebleeds.   Eyes:  Negative for blurred vision and redness.  Respiratory:  Negative for shortness of breath.   Cardiovascular:  Negative for chest pain, palpitations, orthopnea, claudication, leg swelling and PND.  Gastrointestinal:  Negative for melena, nausea and vomiting.  Genitourinary:  Negative for hematuria.  Musculoskeletal:  Negative for falls.  Neurological:  Negative for dizziness and loss of consciousness.  Psychiatric/Behavioral:  Negative for substance abuse.     EKGs/Labs/Other Studies Reviewed:    The following studies were reviewed today:  Echo 01/21/20  1. Left ventricular ejection fraction, by estimation, is 60 to 65%. The  left ventricle has normal function. The left ventricle has no regional  wall motion abnormalities. Left ventricular diastolic parameters are  consistent with Grade I diastolic  dysfunction (impaired relaxation).   2. Right ventricular systolic function is normal. The right ventricular  size is normal.   3. The mitral valve is normal in structure. Trivial mitral valve  regurgitation. No evidence of mitral stenosis.   4. The aortic valve is tricuspid. Aortic valve regurgitation is not  visualized. No aortic stenosis is present.   5. The inferior vena cava is normal in size with greater than 50%  respiratory variability, suggesting right atrial pressure of 3 mmHg.    Cardiac cath 01/22/20 Mid LAD to Dist LAD lesion is 50% stenosed. Mid LAD-1 lesion is 30% stenosed. Mid LAD-2 lesion is 50%  stenosed. 2nd Mrg lesion is 40% stenosed. Prox Cx to Mid Cx lesion is 20% stenosed. A stent was successfully placed. Prox RCA lesion is 99% stenosed. Post intervention, there is a 0% residual stenosis. Prox RCA to Mid RCA lesion is 40% stenosed. Post intervention, there is a 0% residual stenosis.   Multivessel CAD with irregularity of the mid LAD with narrowings of 30 to 50%, mild 20% narrowing in the proximal circumflex with 40% narrowing in the OM vessel; and large dominant RCA with subtotal thrombotic 99% proximal stenosis in the region of mild to moderate plaque above and below the stenosis and 40% smooth mid PLA stenosis   LVEDP 17 mm Hg.   Successful percutaneous coronary intervention with PTCA and DES stenting with a 3.5 x 34 mm Resolute Onyx stent postdilated to 3.80 milliliters with a 99% stenosis moderate narrowings involving below the lesion being reduced to 0%.  There is brisk TIMI-3 flow with no evidence for dissection.  Transient AV block when crossing the aortic valve which responded to atropine.   RECOMMENDATION: DAPT for at least 1 year.  Aggressive lipid-lowering therapy with target LDL less than 70.  Optimal blood pressure control with goal less than 130/80 and ideal less than 120/80.   Coronary Diagrams   Diagnostic Dominance: Right  Intervention         EKG:  EKG is personally reviewed. 04/13/22: NSR, HR 82  Recent Labs: 12/30/2021: ALT 26   Recent Lipid Panel    Component Value Date/Time   CHOL 100 12/30/2021 0718   TRIG 63 12/30/2021 0718   HDL 31 (L) 12/30/2021 0718   CHOLHDL 3.2 12/30/2021 0718   CHOLHDL 5.1 01/21/2020 0308   VLDL 23 01/21/2020 0308   LDLCALC 55 12/30/2021 0718     Physical Exam:    VS:  BP 122/80   Pulse 82   Ht 5\' 10"  (1.778 m)   Wt 200 lb 3.2 oz (90.8 kg)   SpO2 97%   BMI 28.73 kg/m     Wt Readings from Last 3 Encounters:  04/13/22 200 lb 3.2 oz (90.8 kg)  05/06/21 205 lb (93 kg)  11/05/20 200 lb 12.8 oz (91.1  kg)     GEN:  Well nourished, well developed in no acute distress HEENT: Normal NECK: No JVD; No carotid bruits CARDIAC: RRR, no murmurs, rubs, gallops RESPIRATORY:  Clear to auscultation without rales, wheezing or rhonchi  ABDOMEN: Soft, non-tender, non-distended MUSCULOSKELETAL:  No edema; No deformity  SKIN: Warm and dry NEUROLOGIC:  Alert and oriented x 3 PSYCHIATRIC:  Normal affect   ASSESSMENT:    1. Coronary artery disease of native heart with stable angina pectoris, unspecified vessel or lesion type ([place])   2. Mixed hyperlipidemia   3. Essential hypertension   4. Statin intolerance   5. Type 2 diabetes mellitus without complication, without long-term current use of insulin (HCC)     PLAN:    In order of problems listed above:  #NSTEMI #Multivessel CAD s/p PCI to RCA Multivessel CAD with irregularity of the mid LAD with narrowings of 30 to 50%, mild 20% narrowing in the proximal circumflex with 40% narrowing in the OM vessel; and large dominant RCA with subtotal thrombotic 99% proximal stenosis in the region of mild to moderate plaque above and below the stenosis and 40% smooth mid PLA stenosis. S/p PCI to RCA. Currently, doing very well without symptoms. - Echo showed LVEF 60-65%, no WMA, G1DD, trivial MR - Continue ASA 81mg  daily - Plavix stopped 1 year post-PCI per patient wished - Stopped brilinta due to SOB - Continue lipitor 20mg  daily - Off metop due to fatigue - Continue losartan 50mg  daily  #HTN Well controlled and at goal. - Continue losartan 50mg  daily - Continue amlodipine 2.5mg  daily - Monitor blood pressures at home with goal 120/80s or below   #HLD - Atorvastatin 20 mg daily - LDL well controlled at 55 on 12/2021  #DM2 -Management per PCP   Follow-up:  6 months.  Medication Adjustments/Labs and Tests Ordered: Current medicines are reviewed at length with the patient today.  Concerns regarding medicines are outlined above.   Orders Placed  This Encounter  Procedures   EKG 12-Lead   Meds ordered this encounter  Medications   amLODipine (NORVASC) 2.5 MG tablet    Sig: Take 1 tablet (2.5 mg total) by mouth daily.    Dispense:  30 tablet    Refill:  11   atorvastatin (LIPITOR)  20 MG tablet    Sig: Take 1 tablet (20 mg total) by mouth daily.    Dispense:  30 tablet    Refill:  11   losartan (COZAAR) 50 MG tablet    Sig: Take 1 tablet (50 mg total) by mouth daily.    Dispense:  30 tablet    Refill:  11   Patient Instructions  Medication Instructions:   Your physician recommends that you continue on your current medications as directed. Please refer to the Current Medication list given to you today.  *If you need a refill on your cardiac medications before your next appointment, please call your pharmacy*    Follow-Up: At [facility], you and your health needs are our priority.  As part of our continuing mission to provide you with exceptional heart care, we have created designated Provider Care Teams.  These Care Teams include your primary Cardiologist (physician) and Advanced Practice Providers (APPs -  Physician Assistants and Nurse Practitioners) who all work together to provide you with the care you need, when you need it.  We recommend signing up for the patient portal called "MyChart".  Sign up information is provided on this After Visit Summary.  MyChart is used to connect with patients for Virtual Visits (Telemedicine).  Patients are able to view lab/test results, encounter notes, upcoming appointments, etc.  Non-urgent messages can be sent to your provider as well.   To learn more about what you can do with MyChart, go to [email or web address].    Your next appointment:   1 year(s)  Provider:   Freada Bergeron, MD         Signed, Freada Bergeron, MD  04/13/2022 9:36 AM    [place]

## 2022-04-13 ENCOUNTER — Ambulatory Visit: Payer: 59 | Attending: Cardiology | Admitting: Cardiology

## 2022-04-13 ENCOUNTER — Encounter: Payer: Self-pay | Admitting: Cardiology

## 2022-04-13 VITALS — BP 122/80 | HR 82 | Ht 70.0 in | Wt 200.2 lb

## 2022-04-13 DIAGNOSIS — Z789 Other specified health status: Secondary | ICD-10-CM

## 2022-04-13 DIAGNOSIS — E119 Type 2 diabetes mellitus without complications: Secondary | ICD-10-CM

## 2022-04-13 DIAGNOSIS — I10 Essential (primary) hypertension: Secondary | ICD-10-CM | POA: Diagnosis not present

## 2022-04-13 DIAGNOSIS — I25118 Atherosclerotic heart disease of native coronary artery with other forms of angina pectoris: Secondary | ICD-10-CM | POA: Diagnosis not present

## 2022-04-13 DIAGNOSIS — E782 Mixed hyperlipidemia: Secondary | ICD-10-CM

## 2022-04-13 MED ORDER — ATORVASTATIN CALCIUM 20 MG PO TABS: 20.0000 mg | ORAL_TABLET | Freq: Every day | ORAL | 11 refills | Status: DC

## 2022-04-13 MED ORDER — LOSARTAN POTASSIUM 50 MG PO TABS: 50.0000 mg | ORAL_TABLET | Freq: Every day | ORAL | 11 refills | Status: DC

## 2022-04-13 MED ORDER — AMLODIPINE BESYLATE 2.5 MG PO TABS: 2.5000 mg | ORAL_TABLET | Freq: Every day | ORAL | 11 refills | Status: DC

## 2022-04-13 NOTE — Patient Instructions (Signed)
Medication Instructions:   Your physician recommends that you continue on your current medications as directed. Please refer to the Current Medication list given to you today.  *If you need a refill on your cardiac medications before your next appointment, please call your pharmacy*    Follow-Up: At [facility], you and your health needs are our priority.  As part of our continuing mission to provide you with exceptional heart care, we have created designated Provider Care Teams.  These Care Teams include your primary Cardiologist (physician) and Advanced Practice Providers (APPs -  Physician Assistants and Nurse Practitioners) who all work together to provide you with the care you need, when you need it.  We recommend signing up for the patient portal called "MyChart".  Sign up information is provided on this After Visit Summary.  MyChart is used to connect with patients for Virtual Visits (Telemedicine).  Patients are able to view lab/test results, encounter notes, upcoming appointments, etc.  Non-urgent messages can be sent to your provider as well.   To learn more about what you can do with MyChart, go to [email or web address].    Your next appointment:   1 year(s)  Provider:   Heather E Pemberton, MD      

## 2022-06-16 IMAGING — CT CT ABD-PELV W/ CM
2 of 5 series · 16 of 46 positions shown, 18 images · IV contrast (OMNIPAQUE 300)
Comparison: February 10, 2016.

CLINICAL DATA: Acute right lower quadrant abdominal pain.

EXAM:
CT ABDOMEN AND PELVIS WITH CONTRAST
TECHNIQUE: Multidetector CT imaging of the abdomen and pelvis was performed
using the standard protocol following bolus administration of
intravenous contrast.
CONTRAST:  100mL OMNIPAQUE IOHEXOL 300 MG/ML  SOLN

[Series 2: axial st · axial · 0.85mm/px · z∈[+1368,+1808]mm · 13 of 104 slices shown, 15 images]
[im 8/104  soft-tissue]
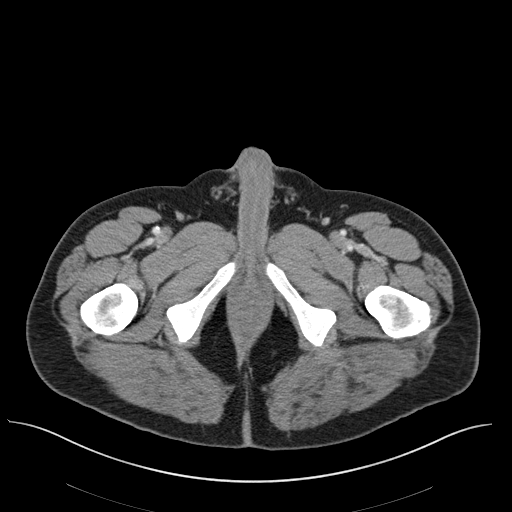
[im 8/104  bone]
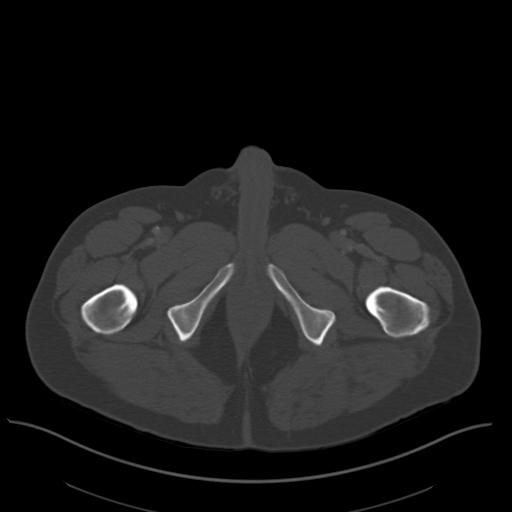
[im 15/104  soft-tissue]
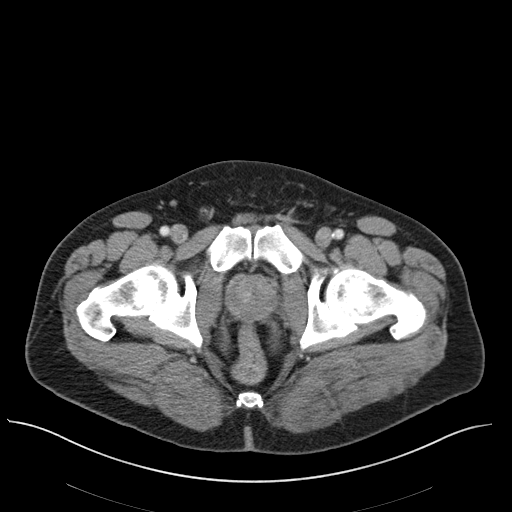
[im 23/104  soft-tissue]
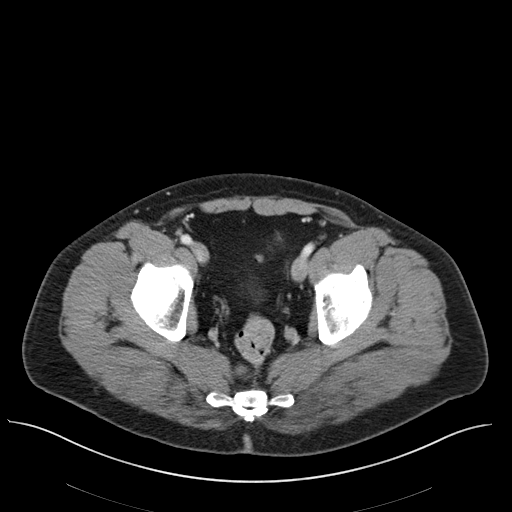
[im 30/104  soft-tissue]
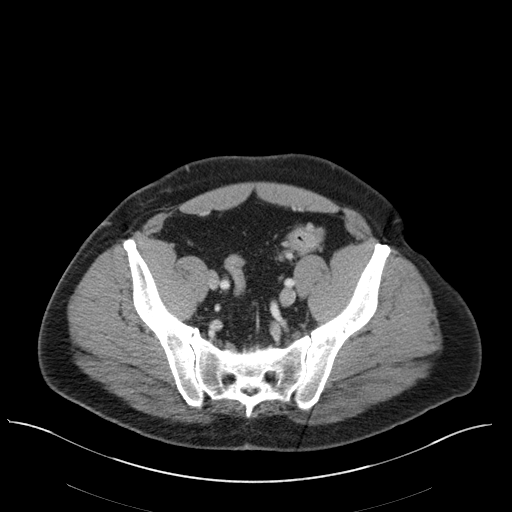
[im 37/104  soft-tissue]
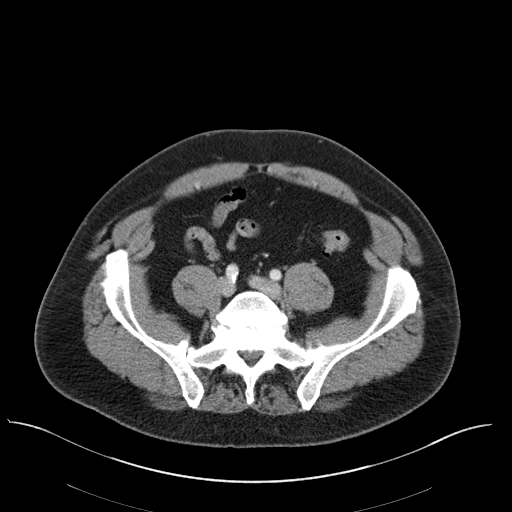
[im 45/104  soft-tissue]
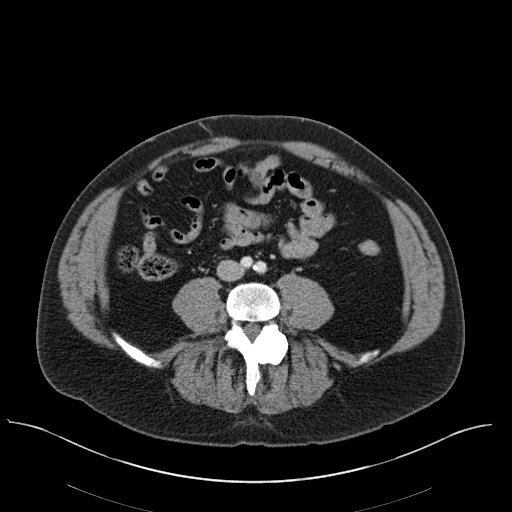
[im 52/104  soft-tissue]
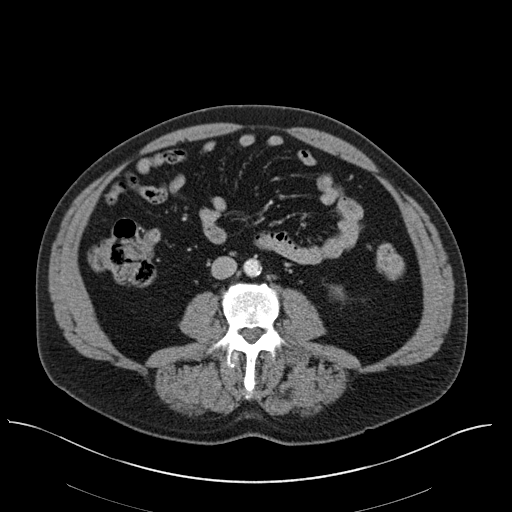
[im 59/104  soft-tissue]
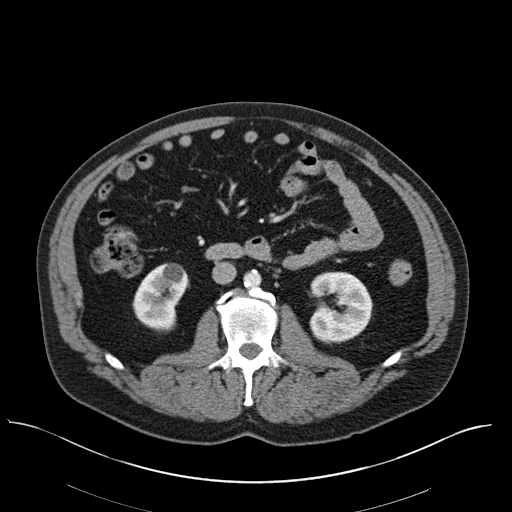
[im 67/104  soft-tissue]
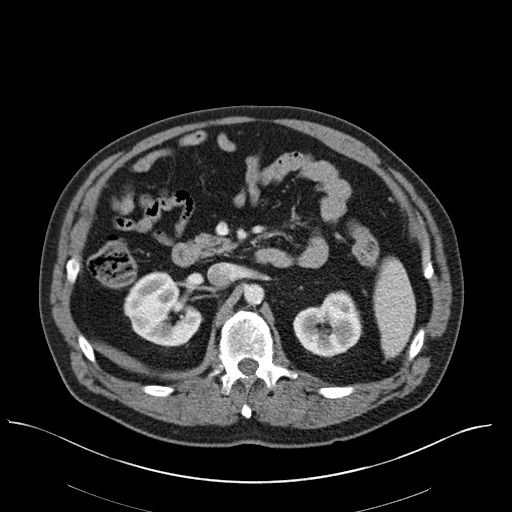
[im 67/104  bone]
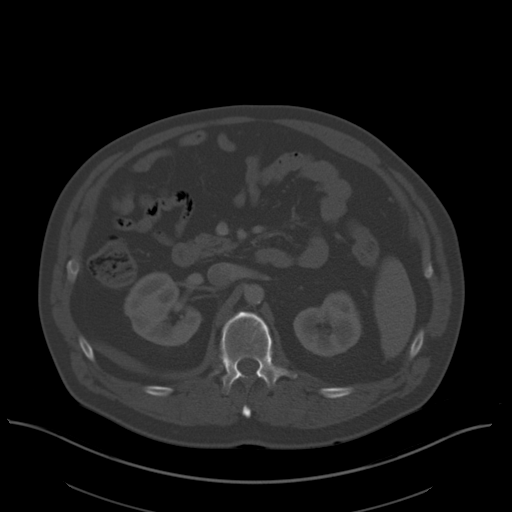
[im 74/104  soft-tissue]
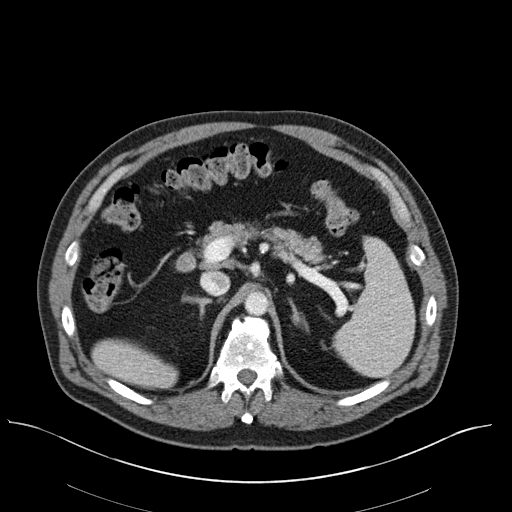
[im 81/104  soft-tissue]
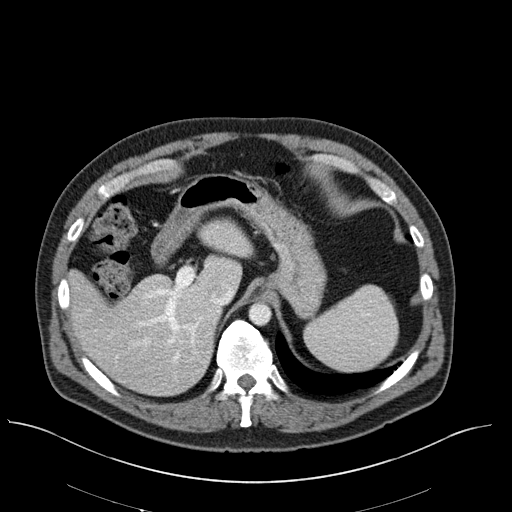
[im 89/104  soft-tissue]
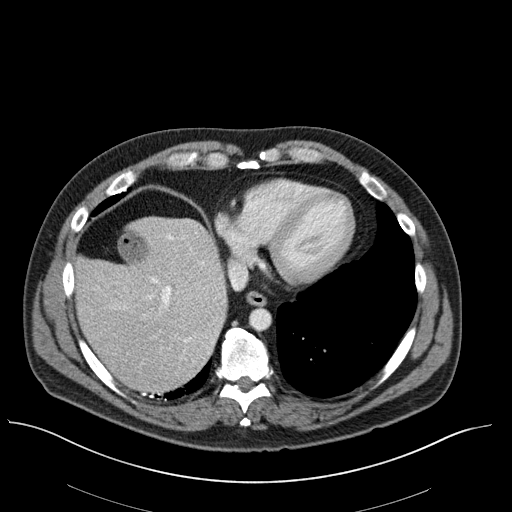
[im 96/104  soft-tissue]
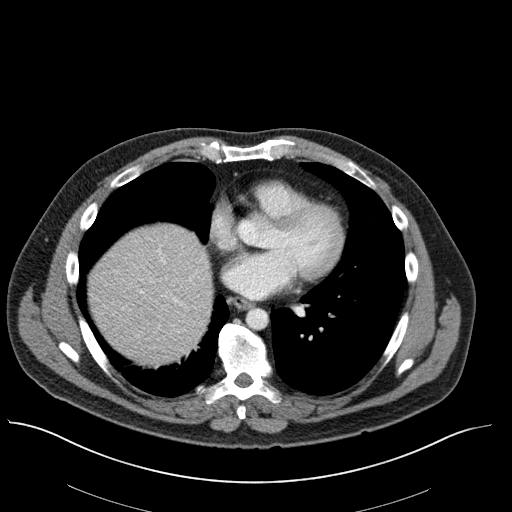

[Series 4: coronal st · coronal · 0.98mm/px · 3 of 133 slices shown]
[im 45/133  soft-tissue]
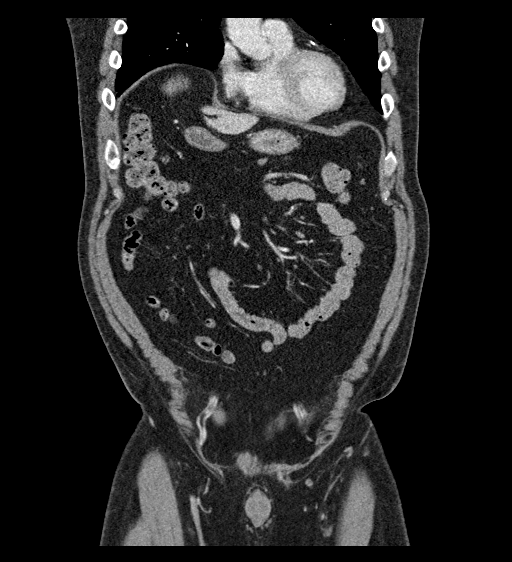
[im 59/133  soft-tissue]
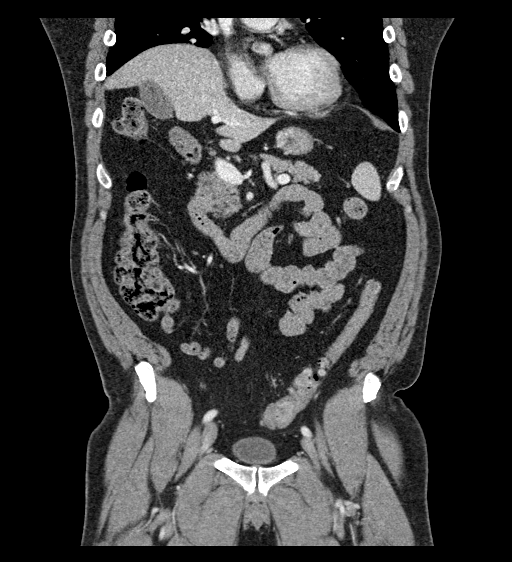
[im 74/133  soft-tissue]
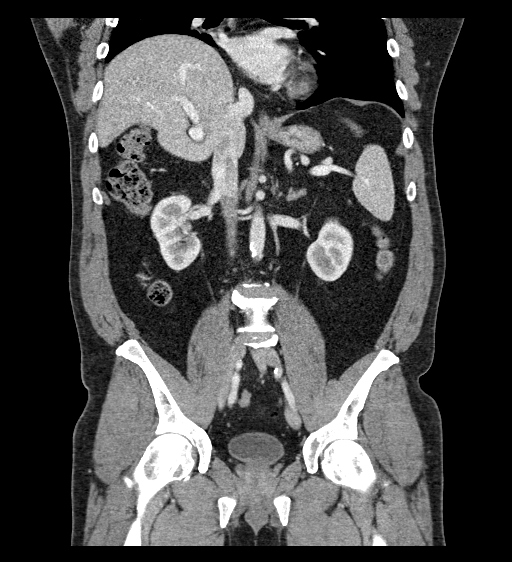

[16 of 46 positions shown; findings below may reference images not displayed]

FINDINGS: Lower chest: No acute abnormality.

Hepatobiliary: Mild cholelithiasis is noted. No biliary dilatation
is noted. The liver is unremarkable.

Pancreas: Unremarkable. No pancreatic ductal dilatation or
surrounding inflammatory changes.

Spleen: Normal in size without focal abnormality.

Adrenals/Urinary Tract: Adrenal glands appear normal. Right renal
cysts are noted. No hydronephrosis or renal obstruction is noted. No
renal or ureteral calculi are noted. Urinary bladder is
unremarkable.

Stomach/Bowel: The stomach appears normal. There is no evidence of
bowel obstruction or inflammation. Sigmoid diverticulosis is noted
without inflammation. Status post appendectomy.

Vascular/Lymphatic: Aortic atherosclerosis. No enlarged abdominal or
pelvic lymph nodes.

Reproductive: Prostate is unremarkable.

Other: No abdominal wall hernia or abnormality. No abdominopelvic
ascites.

Musculoskeletal: Grade 1 anterolisthesis of L5-S1 is noted secondary
to bilateral L5 spondylolysis. No acute osseous abnormality is
noted.
IMPRESSION: 1. Mild cholelithiasis.
2. Sigmoid diverticulosis without inflammation.
3. Grade 1 anterolisthesis of L5-S1 secondary to bilateral L5
spondylolysis.
4. No acute abnormality seen in the abdomen or pelvis.
5. Aortic atherosclerosis.

Aortic Atherosclerosis (DUEE3-T4Y.Y).

## 2023-04-28 ENCOUNTER — Other Ambulatory Visit: Payer: Self-pay

## 2023-04-28 DIAGNOSIS — I25118 Atherosclerotic heart disease of native coronary artery with other forms of angina pectoris: Secondary | ICD-10-CM

## 2023-04-28 DIAGNOSIS — E782 Mixed hyperlipidemia: Secondary | ICD-10-CM

## 2023-04-28 DIAGNOSIS — I10 Essential (primary) hypertension: Secondary | ICD-10-CM

## 2023-04-28 DIAGNOSIS — Z789 Other specified health status: Secondary | ICD-10-CM

## 2023-04-28 DIAGNOSIS — E119 Type 2 diabetes mellitus without complications: Secondary | ICD-10-CM

## 2023-04-28 MED ORDER — AMLODIPINE BESYLATE 2.5 MG PO TABS: 2.5000 mg | ORAL_TABLET | Freq: Every day | ORAL | 0 refills | Status: DC

## 2023-04-28 MED ORDER — LOSARTAN POTASSIUM 50 MG PO TABS: 50.0000 mg | ORAL_TABLET | Freq: Every day | ORAL | 0 refills | Status: DC

## 2023-04-28 MED ORDER — ATORVASTATIN CALCIUM 20 MG PO TABS: 20.0000 mg | ORAL_TABLET | Freq: Every day | ORAL | 0 refills | Status: DC

## 2023-04-28 NOTE — Addendum Note (Signed)
 Addended by: Margaret Pyle D on: 04/28/2023 09:52 AM   Modules accepted: Orders

## 2023-05-31 ENCOUNTER — Other Ambulatory Visit: Payer: Self-pay | Admitting: Physician Assistant

## 2023-05-31 DIAGNOSIS — I25118 Atherosclerotic heart disease of native coronary artery with other forms of angina pectoris: Secondary | ICD-10-CM

## 2023-05-31 DIAGNOSIS — E119 Type 2 diabetes mellitus without complications: Secondary | ICD-10-CM

## 2023-05-31 DIAGNOSIS — E782 Mixed hyperlipidemia: Secondary | ICD-10-CM

## 2023-05-31 DIAGNOSIS — I10 Essential (primary) hypertension: Secondary | ICD-10-CM

## 2023-05-31 DIAGNOSIS — Z789 Other specified health status: Secondary | ICD-10-CM

## 2023-05-31 NOTE — Telephone Encounter (Signed)
*  STAT* If patient is at the pharmacy, call can be transferred to refill team.   1. Which medications need to be refilled? (please list name of each medication and dose if known)  Amlodipine, Atorvastatin and Losartan   2. Would you like to learn more about the convenience, safety, & potential cost savings by using the [facility]?    3. Are you open to using the [facility] (Type [facility]. .   4. Which pharmacy/location (including street and city if local pharmacy) is medication to be sent to? [facility] [place]   5. Do they need a 30 day or 90 day supply? Enough until his appointment on 70-10-25

## 2023-06-01 ENCOUNTER — Other Ambulatory Visit: Payer: Self-pay

## 2023-08-11 ENCOUNTER — Ambulatory Visit (INDEPENDENT_AMBULATORY_CARE_PROVIDER_SITE_OTHER): Admitting: Family Medicine

## 2023-08-11 VITALS — BP 153/67 | Ht 70.0 in | Wt 200.0 lb

## 2023-08-11 DIAGNOSIS — G5701 Lesion of sciatic nerve, right lower limb: Secondary | ICD-10-CM

## 2023-08-11 NOTE — Patient Instructions (Signed)
 You have piriformis syndrome. Try to avoid painful activities when possible. Start physical therapy and do home exercises on days you don't go to therapy. Tylenol , aleve as needed. Heat 15 minutes at a time as needed Follow up with me in 1 month but call sooner if things aren't going the right direction.

## 2023-08-12 NOTE — Progress Notes (Signed)
 PCP: Kristina Pfeiffer, PA-C  Subjective:   HPI: Patient is a 58 y.o. male here for low back pain.  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 He also has a history of left-sided sciatica. He states that he has had to work more recently on his feet with a lot of lifting and bending more than usual. And because of this he has felt like he is getting a sharp pain in his right buttocks that feels like a stabbing knife pain. It is worse with standing. He occasionally will get a tingle down his leg but this is more rare. No bowel or bladder dysfunction.  Past Medical History:  Diagnosis Date   CAD (coronary artery disease)    CKD (chronic kidney disease), stage II    Collapsed lung    DDD (degenerative disc disease), cervical    Diabetes mellitus without complication (HCC)    Prior A1C remotely 8.9, changed diet, most recently 5.7   Enlarged prostate    Essential hypertension    HTN (hypertension)    Hyperlipidemia LDL goal <70     Current Outpatient Medications on File Prior to Visit  Medication Sig Dispense Refill   amLODipine  (NORVASC ) 2.5 MG tablet Take 1 tablet (2.5 mg total) by mouth daily. Pt needs to keep upcoming appt in July before more refills are dispensed 90 tablet 1   aspirin  EC 81 MG tablet Take 81 mg by mouth daily. Swallow whole.     atorvastatin  (LIPITOR ) 20 MG tablet Take 1 tablet (20 mg total) by mouth daily. Pt needs to keep upcoming appt in July before more refills are dispensed 90 tablet 1   losartan  (COZAAR ) 50 MG tablet Take 1 tablet (50 mg total) by mouth daily. Pt needs to keep upcoming appt in July before more refills are dispensed 90 tablet 1   [DISCONTINUED] metFORMIN  (GLUCOPHAGE ) 500 MG tablet Take 1 tablet (500 mg total) by mouth 2 (two) times daily with a meal. (Patient not taking: No sig reported) 20 tablet 0   No current facility-administered medications on file prior to visit.    Past Surgical  History:  Procedure Laterality Date   APPENDECTOMY     CORONARY STENT INTERVENTION N/A 01/22/2020   Procedure: CORONARY STENT INTERVENTION;  Surgeon: Millicent Ally, MD;  Location: [facility];  Service: Cardiovascular;  Laterality: N/A;   HERNIA REPAIR     LEFT HEART CATH AND CORONARY ANGIOGRAPHY N/A 01/22/2020   Procedure: LEFT HEART CATH AND CORONARY ANGIOGRAPHY;  Surgeon: Millicent Ally, MD;  Location: [facility];  Service: Cardiovascular;  Laterality: N/A;    Allergies  Allergen Reactions   Crestor  Coree.Contes ] Other (See Comments)    Pt reports causes headaches   Lipitor  [Atorvastatin ] Other (See Comments)    Reports muscle aches and fatigue   Penicillins Other (See Comments)    Patient states his joints felt like they were locking up  Has patient had a PCN reaction causing immediate rash, facial/tongue/throat swelling, SOB or lightheadedness with hypotension: no Has patient had a PCN reaction causing severe rash involving mucus membranes or skin necrosis: no Has patient had a PCN reaction that required hospitalization: no Has patient had a PCN reaction occurring within the last 10 years: no If all of the above answers are "NO", then may proceed with Cephalosporin use.    Sulfa Antibiotics Other (See Comments)    Patient is unsure of allergy  BP (!) 153/67 (BP Location: Right Arm, Patient Position: Sitting, Cuff Size: Normal)   Ht 5\' 10"  (1.778 m)   Wt 200 lb (90.7 kg)   BMI 28.70 kg/m       No data to display              No data to display              Objective:  Physical Exam:  Gen: NAD, comfortable in exam room  Back: No gross deformity, scoliosis. TTP right piriformis and external rotators.  No midline or bony TTP. FROM. Strength LEs 5/5 all muscle groups.   2+ MSRs in patellar and achilles tendons, equal bilaterally. Negative SLRs. Sensation intact to light touch bilaterally. Negative logroll bilateral hips. Negative  piriformis test, FABER, FADIR.   Assessment & Plan:  1.  Back pain: Consistent with piriformis syndrome and strain, spasms to other external rotators.  Start physical therapy and will do home exercises as well.  Tylenol  and Aleve as needed.  Heat as needed.  Follow-up in 1 month.

## 2023-09-14 NOTE — Progress Notes (Unsigned)
   Cardiology Office Note   Date:  09/14/2023   ID:  Guy [name], DOB 1965-04-20, MRN [id number]  PCP:  Nena Cyndee LABOR, PA-C  Cardiologist:   Vina Gull, MD       History of Present Illness: Guy [name] is a 58 y.o. male with a history of       No outpatient medications have been marked as taking for the 09/16/23 encounter (Appointment) with Gull Vina GAILS, MD.     Allergies:   Crestor  [rosuvastatin ], Lipitor  [atorvastatin ], Penicillins, and Sulfa antibiotics   Past Medical History:  Diagnosis Date   CAD (coronary artery disease)    CKD (chronic kidney disease), stage II    Collapsed lung    DDD (degenerative disc disease), cervical    Diabetes mellitus without complication (HCC)    Prior A1C remotely 8.9, changed diet, most recently 5.7   Enlarged prostate    Essential hypertension    HTN (hypertension)    Hyperlipidemia LDL goal <70     Past Surgical History:  Procedure Laterality Date   APPENDECTOMY     CORONARY STENT INTERVENTION N/A 01/22/2020   Procedure: CORONARY STENT INTERVENTION;  Surgeon: Burnard Debby LABOR, MD;  [facility];  Service: Cardiovascular;  Laterality: N/A;   HERNIA REPAIR     LEFT HEART CATH AND CORONARY ANGIOGRAPHY N/A 01/22/2020   Procedure: LEFT HEART CATH AND CORONARY ANGIOGRAPHY;  Surgeon: Burnard Debby LABOR, MD;  [facility]: [facility];  Service: Cardiovascular;  Laterality: N/A;     Social History:  The patient  reports that he has quit smoking. He has never used smokeless tobacco. He reports that he does not drink alcohol and does not use drugs.   Family History:  The patient's family history includes CAD in his maternal grandfather and maternal grandmother; Diabetes in an other family member.    ROS:  Please see the history of present illness. All other systems are reviewed and  Negative to the above problem except as noted.    PHYSICAL EXAM: VS:  There were no vitals taken for this visit.   GEN: Well nourished, well developed, in no acute distress  HEENT: normal  Neck: no JVD, carotid bruits, or masses Cardiac: RRR; no murmurs, rubs, or gallops,no edema  Respiratory:  clear to auscultation bilaterally, normal work of breathing GI: soft, nontender, nondistended, + BS  No hepatomegaly  MS: no deformity Moving all extremities   Skin: warm and dry, no rash Neuro:  Strength and sensation are intact Psych: euthymic mood, full affect   EKG:  EKG is ordered today.   Lipid Panel    Component Value Date/Time   CHOL 100 12/30/2021 0718   TRIG 63 12/30/2021 0718   HDL 31 (L) 12/30/2021 0718   CHOLHDL 3.2 12/30/2021 0718   CHOLHDL 5.1 01/21/2020 0308   VLDL 23 01/21/2020 0308   LDLCALC 55 12/30/2021 0718      Wt Readings from Last 3 Encounters:  08/11/23 200 lb (90.7 kg)  04/13/22 200 lb 3.2 oz (90.8 kg)  05/06/21 205 lb (93 kg)      ASSESSMENT AND PLAN:     Current medicines are reviewed at length with the patient today.  The patient does not have concerns regarding medicines.  Signed, Vina Gull, MD  09/14/2023 12:38 PM    [facility] [address] [place], [place], [place]  [address] Phone: [phone]; Fax: [phone]

## 2023-09-16 ENCOUNTER — Ambulatory Visit: Attending: Internal Medicine | Admitting: Internal Medicine

## 2023-09-16 ENCOUNTER — Encounter: Payer: Self-pay | Admitting: Internal Medicine

## 2023-09-16 VITALS — BP 110/74 | HR 65 | Ht 70.0 in | Wt 195.0 lb

## 2023-09-16 DIAGNOSIS — I10 Essential (primary) hypertension: Secondary | ICD-10-CM | POA: Diagnosis not present

## 2023-09-16 DIAGNOSIS — E119 Type 2 diabetes mellitus without complications: Secondary | ICD-10-CM

## 2023-09-16 DIAGNOSIS — I25118 Atherosclerotic heart disease of native coronary artery with other forms of angina pectoris: Secondary | ICD-10-CM

## 2023-09-16 DIAGNOSIS — G8929 Other chronic pain: Secondary | ICD-10-CM

## 2023-09-16 DIAGNOSIS — E782 Mixed hyperlipidemia: Secondary | ICD-10-CM

## 2023-09-16 DIAGNOSIS — M549 Dorsalgia, unspecified: Secondary | ICD-10-CM

## 2023-09-16 DIAGNOSIS — N184 Chronic kidney disease, stage 4 (severe): Secondary | ICD-10-CM

## 2023-09-16 DIAGNOSIS — Z79899 Other long term (current) drug therapy: Secondary | ICD-10-CM

## 2023-09-16 NOTE — Patient Instructions (Signed)
 Medication Instructions:  The current medical regimen is effective;  continue present plan and medications.  *If you need a refill on your cardiac medications before your next appointment, please call your pharmacy*  Lab Work: Please have blood work today as ordered.  If you have labs (blood work) drawn today and your tests are completely normal, you will receive your results only by: MyChart Message (if you have MyChart) OR A paper copy in the mail If you have any lab test that is abnormal or we need to change your treatment, we will call you to review the results.  You have been referred to Dr Darlyn Sharps in Sports Medicine for back pain.  Follow-Up: At [facility], you and your health needs are our priority.  As part of our continuing mission to provide you with exceptional heart care, our providers are all part of one team.  This team includes your primary Cardiologist (physician) and Advanced Practice Providers or APPs (Physician Assistants and Nurse Practitioners) who all work together to provide you with the care you need, when you need it.  Your next appointment:   10 month(s)  Provider:   Vina Gull, MD    We recommend signing up for the patient portal called MyChart.  Sign up information is provided on this After Visit Summary.  MyChart is used to connect with patients for Virtual Visits (Telemedicine).  Patients are able to view lab/test results, encounter notes, upcoming appointments, etc.  Non-urgent messages can be sent to your provider as well.   To learn more about what you can do with MyChart, go to [email or web address].

## 2023-09-17 ENCOUNTER — Ambulatory Visit: Payer: Self-pay | Admitting: *Deleted

## 2023-09-18 LAB — NMR, LIPOPROFILE
Cholesterol, Total: 124 mg/dL (ref 100–199)
HDL Particle Number: 27.9 umol/L — ABNORMAL LOW (ref >=30.5)
HDL-C: 35 mg/dL — ABNORMAL LOW (ref >39)
LDL Particle Number: 968 nmol/L (ref <1000)
LDL Size: 20.4 nm — ABNORMAL LOW (ref >20.5)
LDL-C (NIH Calc): 77 mg/dL (ref 0–99)
LP-IR Score: 59 — ABNORMAL HIGH (ref <=45)
Small LDL Particle Number: 506 nmol/L (ref <=527)
Triglycerides: 53 mg/dL (ref 0–149)

## 2023-09-18 LAB — COMPREHENSIVE METABOLIC PANEL WITH GFR
ALT: 21 IU/L (ref 0–44)
AST: 22 IU/L (ref 0–40)
Albumin: 4.6 g/dL (ref 3.8–4.9)
Alkaline Phosphatase: 144 IU/L — ABNORMAL HIGH (ref 44–121)
BUN/Creatinine Ratio: 17 (ref 9–20)
BUN: 18 mg/dL (ref 6–24)
Bilirubin Total: 0.7 mg/dL (ref 0.0–1.2)
CO2: 20 mmol/L (ref 20–29)
Calcium: 9.2 mg/dL (ref 8.7–10.2)
Chloride: 99 mmol/L (ref 96–106)
Creatinine, Ser: 1.08 mg/dL (ref 0.76–1.27)
Globulin, Total: 2.4 g/dL (ref 1.5–4.5)
Glucose: 112 mg/dL — ABNORMAL HIGH (ref 70–99)
Potassium: 4.3 mmol/L (ref 3.5–5.2)
Sodium: 139 mmol/L (ref 134–144)
Total Protein: 7 g/dL (ref 6.0–8.5)
eGFR: 80 mL/min/1.73 (ref >59)

## 2023-09-18 LAB — CBC
Hematocrit: 45.3 % (ref 37.5–51.0)
Hemoglobin: 15.2 g/dL (ref 13.0–17.7)
MCH: 31.1 pg (ref 26.6–33.0)
MCHC: 33.6 g/dL (ref 31.5–35.7)
MCV: 93 fL (ref 79–97)
Platelets: 235 x10E3/uL (ref 150–450)
RBC: 4.88 x10E6/uL (ref 4.14–5.80)
RDW: 11.9 % (ref 11.6–15.4)
WBC: 8.4 x10E3/uL (ref 3.4–10.8)

## 2023-09-18 LAB — HEMOGLOBIN A1C
Est. average glucose Bld gHb Est-mCnc: 137 mg/dL
Hgb A1c MFr Bld: 6.4 % — ABNORMAL HIGH (ref 4.8–5.6)

## 2023-09-18 LAB — LIPOPROTEIN A (LPA): Lipoprotein (a): <8.4 nmol/L (ref <75.0)

## 2023-09-18 LAB — APOLIPOPROTEIN B: Apolipoprotein B: 75 mg/dL (ref <90)

## 2023-11-23 ENCOUNTER — Other Ambulatory Visit: Payer: Self-pay | Admitting: Physician Assistant

## 2023-11-23 DIAGNOSIS — I25118 Atherosclerotic heart disease of native coronary artery with other forms of angina pectoris: Secondary | ICD-10-CM

## 2023-11-23 DIAGNOSIS — Z789 Other specified health status: Secondary | ICD-10-CM

## 2023-11-23 DIAGNOSIS — E119 Type 2 diabetes mellitus without complications: Secondary | ICD-10-CM

## 2023-11-23 DIAGNOSIS — I10 Essential (primary) hypertension: Secondary | ICD-10-CM

## 2023-11-23 DIAGNOSIS — E782 Mixed hyperlipidemia: Secondary | ICD-10-CM

## 2023-12-20 NOTE — Progress Notes (Unsigned)
 [facility] [address] [place] [address] Phone: [phone] Subjective:   LILLETTE Berwyn Posey, am serving as a scribe for Dr. Arthea Claudene.  I'm seeing this patient by the request  of:  Dr. Vina Gull MD   CC: Low back pain  YEP:Dlagzrupcz  Guy [name] is a 58 y.o. male coming in with complaint of back pain past medical history significant for an NSTEMI and coronary artery disease.  In June seen by another sports medicine provider and made the diagnosis of piriformis syndrome.  Patient states that he has had back pain for years. Has to bend a lot at work. Made some ergonomic changes at work. Had epidural injections which helped sometimes. Was suppose to get RFA. Back is stiff. Has pain in middle of back. Tries to stretch in morning which has been helpful. Pain can radiate into the L glute.     CT of the abdomen pelvis in 2021 showed grade 1 anterior listhesis at L5-S1 with bilateral L5 spondylolysis  Past Medical History:  Diagnosis Date   CAD (coronary artery disease)    CKD (chronic kidney disease), stage II    Collapsed lung    DDD (degenerative disc disease), cervical    Diabetes mellitus without complication (HCC)    Prior A1C remotely 8.9, changed diet, most recently 5.7   Enlarged prostate    Essential hypertension    HTN (hypertension)    Hyperlipidemia LDL goal <70    Past Surgical History:  Procedure Laterality Date   APPENDECTOMY     CORONARY STENT INTERVENTION N/A 01/22/2020   Procedure: CORONARY STENT INTERVENTION;  Surgeon: Burnard Debby LABOR, MD;  [facility]: [facility];  Service: Cardiovascular;  Laterality: N/A;   HERNIA REPAIR     LEFT HEART CATH AND CORONARY ANGIOGRAPHY N/A 01/22/2020   Procedure: LEFT HEART CATH AND CORONARY ANGIOGRAPHY;  Surgeon: Burnard Debby LABOR, MD;  Location: [facility];  Service: Cardiovascular;  Laterality: N/A;   Social History   Socioeconomic History   Marital status: Divorced     Spouse name: Not on file   Number of children: Not on file   Years of education: Not on file   Highest education level: Not on file  Occupational History   Not on file  Tobacco Use   Smoking status: Former   Smokeless tobacco: Never  Substance and Sexual Activity   Alcohol use: No   Drug use: No   Sexual activity: Yes  Other Topics Concern   Not on file  Social History Narrative   Lives in [place].  Works at [employer]: Not on [employer] Insecurity: Not on file  Transportation Needs: Not on file  Physical Activity: Not on file  Stress: Not on file  Social Connections: Unknown (07/21/2021)   Received from [employer]   Social Network    Social Network: Not on file   Allergies  Allergen Reactions   Crestor  [Rosuvastatin ] Other (See Comments)    Pt reports causes headaches   Lipitor  [Atorvastatin ] Other (See Comments)    Reports muscle aches and fatigue   Metformin  Hcl Other (See Comments)   Penicillins Other (See Comments)    Patient states his joints felt like they were locking up  Has patient had a PCN reaction causing immediate rash, facial/tongue/throat swelling, SOB or lightheadedness with hypotension: no Has patient had a PCN reaction causing severe  rash involving mucus membranes or skin necrosis: no Has patient had a PCN reaction that required hospitalization: no Has patient had a PCN reaction occurring within the last 10 years: no If all of the above answers are NO, then may proceed with Cephalosporin use.    Sulfa Antibiotics Other (See Comments)    Patient is unsure of allergy   Family History  Problem Relation Age of Onset   Diabetes Other    CAD Maternal Grandmother    CAD Maternal Grandfather      Current Outpatient Medications (Cardiovascular):    amLODipine  (NORVASC ) 2.5 MG tablet, TAKE 1 TABLET BY MOUTH DAILY   atorvastatin  (LIPITOR ) 20 MG tablet, TAKE 1 TABLET BY MOUTH DAILY    losartan  (COZAAR ) 50 MG tablet, TAKE 1 TABLET BY MOUTH DAILY   Current Outpatient Medications (Analgesics):    aspirin  EC 81 MG tablet, Take 81 mg by mouth daily. Swallow whole.     Reviewed prior external information including notes and imaging from  primary care provider As well as notes that were available from care everywhere and other healthcare systems.  Past medical history, social, surgical and family history all reviewed in electronic medical record.  No pertanent information unless stated regarding to the chief complaint.   Review of Systems:  No headache, visual changes, nausea, vomiting, diarrhea, constipation, dizziness, abdominal pain, skin rash, fevers, chills, night sweats, weight loss, swollen lymph nodes, body aches, joint swelling, chest pain, shortness of breath, mood changes. POSITIVE muscle aches  Objective  Blood pressure 132/84, pulse 72, height 5' 10 (1.778 m), SpO2 95%.   General: No apparent distress alert and oriented x3 mood and affect normal, dressed appropriately.  HEENT: Pupils equal, extraocular movements intact  Respiratory: Patient's speak in full sentences and does not appear short of breath  Cardiovascular: No lower extremity edema, non tender, no erythema  Low back exam shows have significant loss of lordosis noted.  Some atrophy noted of the gluteal area bilaterally.  Some limited range of motion in all planes.  Neurovascular intact distally.  No atrophy of the lower extremities.  [id number]; 15 additional minutes spent for Therapeutic exercises as stated in above notes.  This included exercises focusing on stretching, strengthening, with significant focus on eccentric aspects.   Long term goals include an improvement in range of motion, strength, endurance as well as avoiding reinjury. Patient's frequency would include in 1-2 ti okay mes a day, 3-5 times a week for a duration of 6-12 weeks.  Low back exercises that included:  Pelvic tilt/bracing  instruction to focus on control of the pelvic girdle and lower abdominal muscles  Glute strengthening exercises, focusing on proper firing of the glutes without engaging the low back muscles Proper stretching techniques for maximum relief for the hamstrings, hip flexors, low back and some rotation where tolerated Proper technique shown and discussed handout in great detail with ATC.  All questions were discussed and answered.      Impression and Recommendations:    The above documentation has been reviewed and is accurate and complete Zubin Pontillo M Haim Hansson, DO

## 2023-12-22 ENCOUNTER — Ambulatory Visit: Admitting: Family Medicine

## 2023-12-22 ENCOUNTER — Encounter: Payer: Self-pay | Admitting: Family Medicine

## 2023-12-22 ENCOUNTER — Ambulatory Visit (INDEPENDENT_AMBULATORY_CARE_PROVIDER_SITE_OTHER)

## 2023-12-22 VITALS — BP 132/84 | HR 72 | Ht 70.0 in

## 2023-12-22 DIAGNOSIS — M545 Low back pain, unspecified: Secondary | ICD-10-CM | POA: Diagnosis not present

## 2023-12-22 DIAGNOSIS — M4317 Spondylolisthesis, lumbosacral region: Secondary | ICD-10-CM | POA: Diagnosis not present

## 2023-12-22 NOTE — Patient Instructions (Signed)
 Xray flexion and ext Exercises See me again in 3 months

## 2023-12-22 NOTE — Assessment & Plan Note (Addendum)
 Significant on previous imaging on CT scan and do want to get repeat x-rays to further evaluate and we will get them in flexion extension to know how much instability of the back is noted.  We discussed core strengthening, home exercises, patient at this feeling is he would like to discontinue to work on home exercises.  Hold on any physical therapy or medications.  Hold on any advanced imaging but may need to consider if any worsening symptoms.  Follow-up with me again 12 weeks.

## 2023-12-27 ENCOUNTER — Ambulatory Visit: Payer: Self-pay | Admitting: Family Medicine
